# Patient Record
Sex: Female | Born: 1965 | Race: Black or African American | Hispanic: No | Smoking: Never smoker
Health system: Southern US, Community
[De-identification: ages and names within clinical notes are randomized; demographics above are authoritative.]

## PROBLEM LIST (undated history)

## (undated) DIAGNOSIS — M109 Gout, unspecified: Secondary | ICD-10-CM

## (undated) DIAGNOSIS — I10 Essential (primary) hypertension: Secondary | ICD-10-CM

## (undated) DIAGNOSIS — M1990 Unspecified osteoarthritis, unspecified site: Secondary | ICD-10-CM

## (undated) DIAGNOSIS — G629 Polyneuropathy, unspecified: Secondary | ICD-10-CM

## (undated) HISTORY — PX: TUBAL LIGATION: SHX77

## (undated) HISTORY — PX: ANKLE SURGERY: SHX546

## (undated) HISTORY — PX: TONSILLECTOMY AND ADENOIDECTOMY: SHX28

---

## 2013-07-12 ENCOUNTER — Ambulatory Visit: Payer: Self-pay | Admitting: Unknown Physician Specialty

## 2013-07-13 LAB — PATHOLOGY REPORT

## 2014-04-05 DIAGNOSIS — M1712 Unilateral primary osteoarthritis, left knee: Secondary | ICD-10-CM | POA: Insufficient documentation

## 2014-05-25 DIAGNOSIS — G8929 Other chronic pain: Secondary | ICD-10-CM | POA: Insufficient documentation

## 2014-05-25 DIAGNOSIS — Z6841 Body Mass Index (BMI) 40.0 and over, adult: Secondary | ICD-10-CM

## 2014-05-25 DIAGNOSIS — M25562 Pain in left knee: Secondary | ICD-10-CM | POA: Insufficient documentation

## 2014-05-25 DIAGNOSIS — Z79899 Other long term (current) drug therapy: Secondary | ICD-10-CM | POA: Insufficient documentation

## 2014-05-25 DIAGNOSIS — M25571 Pain in right ankle and joints of right foot: Secondary | ICD-10-CM

## 2014-12-29 NOTE — Op Note (Signed)
PATIENT NAME:  Gwendolyn Gutierrez, Gwendolyn Gutierrez MR#:  [id number] DATE OF BIRTH:  July 08, 1966  DATE OF PROCEDURE:  07/12/2013  PREOPERATIVE DIAGNOSIS: Chronic tonsillitis.   POSTOPERATIVE DIAGNOSIS: Chronic tonsillitis.   SURGEON: Beverly Gust, MD  OPERATION PERFORMED: Tonsillectomy.   OPERATIVE FINDINGS: Large cryptic tonsils.   DESCRIPTION OF THE PROCEDURE:  Kinlee Garrison was identified in the holding area and taken to the operating room and placed in the supine position.  After general endotracheal anesthesia, the table was turned 45 degrees and the patient was draped in the usual fashion for a tonsillectomy.  A mouth gag was inserted into the oral cavity.  There were large tonsils.  Beginning on the left-hand side a tenaculum was used to grasp the tonsil and the Bovie cautery was used to dissect it free from the fossa.  In a similar fashion, the right tonsil was removed.  Meticulous hemostasis was achieved using the Bovie cautery.  With both tonsils removed and no active bleeding, 0.5% plain Marcaine was used to inject the anterior and posterior tonsillar pillars bilaterally.  A total of 7 mL Marcaine was used.  The patient tolerated the procedure well and was awakened in the operating room and taken to the recovery room in stable condition.   CULTURES:  None.  SPECIMENS:  Tonsils.  ESTIMATED BLOOD LOSS:  Less than 10 ml.        ____________________________ Roena Malady, MD ctm:sg D: 07/12/2013 09:04:23 ET T: 07/12/2013 09:11:21 ET JOB#: [id number]  cc: Roena Malady, MD, <Dictator> Roena Malady MD ELECTRONICALLY SIGNED 07/25/2013 7:58

## 2015-06-04 ENCOUNTER — Other Ambulatory Visit (HOSPITAL_COMMUNITY): Payer: Self-pay | Admitting: Physician Assistant

## 2015-06-04 ENCOUNTER — Ambulatory Visit (HOSPITAL_COMMUNITY): Payer: Medicare Other | Attending: Family Medicine | Admitting: Physical Therapy

## 2015-06-04 DIAGNOSIS — R293 Abnormal posture: Secondary | ICD-10-CM | POA: Diagnosis present

## 2015-06-04 DIAGNOSIS — Z1231 Encounter for screening mammogram for malignant neoplasm of breast: Secondary | ICD-10-CM

## 2015-06-04 DIAGNOSIS — M25659 Stiffness of unspecified hip, not elsewhere classified: Secondary | ICD-10-CM | POA: Diagnosis present

## 2015-06-04 DIAGNOSIS — R262 Difficulty in walking, not elsewhere classified: Secondary | ICD-10-CM | POA: Insufficient documentation

## 2015-06-04 DIAGNOSIS — M6281 Muscle weakness (generalized): Secondary | ICD-10-CM | POA: Insufficient documentation

## 2015-06-04 DIAGNOSIS — M25562 Pain in left knee: Secondary | ICD-10-CM | POA: Diagnosis present

## 2015-06-04 DIAGNOSIS — M25673 Stiffness of unspecified ankle, not elsewhere classified: Secondary | ICD-10-CM

## 2015-06-04 NOTE — Patient Instructions (Signed)
   Heel Raises  Rise up onto toes, then return. Repeat 10 times, 2x/day.  Toe Raises  Raise up toes, and then return. Repeat 10 times, 2x/day.    Mini squats  Stand at countertop and bend knees for a mini squat. If you feel pain, do not go down as far.   Repeat 5-10 times, 2x/day.     LONG ARC QUAD - LAQ - HIGH SEAT  While seated with your knee in a bent position, slowly straighten your knee as you raise your foot upwards as shown. Hold for 2 seconds when it is straight, hold for 2 seconds when it is bent.   Repeat 10 times, 2x/day.   Functional Quadriceps: Sit to Stand   Sit on edge of chair, feet flat on floor. Stand upright, extending knees fully. Repeat _5___ times per set. Do _1___ sets per session. Do ___2_ sessions per day.  http://orth.exer.us/735   Copyright  VHI. All rights reserved.

## 2015-06-04 NOTE — Therapy (Signed)
[place], [place], [address] Phone: [phone]   Fax:  [phone]  Physical Therapy Evaluation  Patient Details  Name: Gwendolyn Gutierrez MRN: [id number] Date of Birth: 12-Sep-1965 Referring Provider:  Shade Flood, MD  Encounter Date: 06/04/2015      PT End of Session - 06/04/15 1717    Visit Number 1   Number of Visits 12   Date for PT Re-Evaluation 07/02/15   Authorization Type Medicare/Medicaid    Authorization Time Period 06/04/15 to 08/04/15   Authorization - Visit Number 1   Authorization - Number of Visits 10   PT Start Time 6629   PT Stop Time 1557   PT Time Calculation (min) 42 min   Activity Tolerance Patient tolerated treatment well;Patient limited by pain   Behavior During Therapy [facility] for tasks assessed/performed      No past medical history on file.  No past surgical history on file.  There were no vitals filed for this visit.  Visit Diagnosis:  Left knee pain - Plan: PT plan of care cert/re-cert  Muscle weakness - Plan: PT plan of care cert/re-cert  Poor posture - Plan: PT plan of care cert/re-cert  Difficulty walking - Plan: PT plan of care cert/re-cert  Ankle stiffness, unspecified laterality - Plan: PT plan of care cert/re-cert  Hip stiffness, unspecified laterality - Plan: PT plan of care cert/re-cert      Subjective Assessment - 06/04/15 1518    Subjective L knee is fairly painful especially with weight bearing; usually walks with  a walker. Does not take stairs, usually uses ramps. Has quite a bit of swelling. Tries not to do a lot because it hurts very badly.    Pertinent History L knee pain started about 2 years ago; reports that the arthitis has gotten worse recently, no injury or acute incident that made it start hurting worse. Has a history of R ankle arthoscopic surgery.   How long can you sit comfortably? no limits    How long can you stand comfortably? 3-5 minutes  and it gets uncomfortable    How long can you walk comfortably? 5-10 minutes with walker    Patient Stated Goals not need to use walker    Currently in Pain? No/denies  not in sitting, but if she stood up it would get to 8/10            [facility] PT Assessment - 06/04/15 0001    Assessment   Medical Diagnosis chronic L knee pain    Onset Date/Surgical Date --  about 2 years ago    Next MD Visit October with Dr. Terance Hart    Precautions   Precautions None   Restrictions   Weight Bearing Restrictions No   Balance Screen   Has the patient fallen in the past 6 months No   Has the patient had a decrease in activity level because of a fear of falling?  Yes   Is the patient reluctant to leave their home because of a fear of falling?  No   Prior Function   Level of Independence Needs assistance with homemaking;Independent with gait;Independent with transfers   Vocation On disability   Leisure church activities, spending time with grand-children    Observation/Other Assessments   Focus on Therapeutic Outcomes (FOTO)  72% limited    Posture/Postural Control   Posture Comments flexed at hips, reduced weight bearing L LE, reduced thoracic kyphosis and significant increase lumbar lordosis  AROM   Right Hip External Rotation  --  WFL    Right Hip Internal Rotation  20   Left Hip External Rotation  --  WFL    Left Hip Internal Rotation  20   Left Knee Extension 2   Left Knee Flexion 104   Right Ankle Dorsiflexion 5   Left Ankle Dorsiflexion 0   Strength   Right Hip Flexion 3-/5   Right Hip Extension 3/5   Right Hip ABduction 2/5   Left Hip Flexion 3-/5   Left Hip Extension 3/5   Left Hip ABduction 3-/5   Right Knee Flexion 4+/5   Right Knee Extension 5/5   Left Knee Flexion 4-/5   Left Knee Extension 4/5   Right Ankle Dorsiflexion 5/5   Left Ankle Dorsiflexion 3/5   Ambulation/Gait   Gait Comments reduced stance L/reduced step R, flexed at hips, proximal msucle weakness,  prnation B, reduced rotation of turnk and pelvis                            PT Education - 06/04/15 1717    Education provided Yes   Education Details prognosis, HEP, plan of care    Person(s) Educated Patient   Methods Explanation;Handout   Comprehension Verbalized understanding;Returned demonstration;Need further instruction          PT Short Term Goals - 06/04/15 1723    PT SHORT TERM GOAL #1   Title Patient will demonstrate bilateral ankle dorsiflexion range of at least 10 degrees to improve gait mechanics and improve functional loading    Time 3   Period Weeks   Status New   PT SHORT TERM GOAL #2   Title Patient will demonstrate bilateral hip IR range of at least 30 degrees to improve overall mechanics and reduce stress on knee    Time 3   Period Weeks   Status New   PT SHORT TERM GOAL #3   Title Patient will demonstrate equal weight bearing on each leg with even step lengths, improved gait speed, and pain no more than 5/10 with gait with walker    Time 3   Period Weeks   Status New   PT SHORT TERM GOAL #4   Title Patient to be independent in correctly and consistently performing appropriate HEP, to be updated PRN    Time 3   Period Weeks   Status New           PT Long Term Goals - 06/04/15 1725    PT LONG TERM GOAL #1   Title Patient to demonstrate bilateral ankle DF range of at least 15 degrees and bilateral hip IR range of at least 35 degrees    Time 6   Period Weeks   Status New   PT LONG TERM GOAL #2   Title Patient will demonstrate bilateral leg strength of 5/5 and proximal muscle strength of at least 4/5    Time 6   Period Weeks   Status New   PT LONG TERM GOAL #3   Title Patient to report that she has been participating in water exercise at the [facility] at least 3 times per week for at least 20 minutes in order to strengthen musculature around her knee and promote overall healtheir lifestyle    Time 6   Period Weeks   Status New    PT LONG TERM GOAL #4   Title Patient will demonstrate the ability  to perform static stance and ambulate with walker for at least 60 minutes with pain no more than 3/10   Time 6   Period Weeks   Status New               Plan - Jul 04, 2015 1718    Clinical Impression Statement Patient presents with long standing L knee pain, impairments in posture and gait mechanics, hip/ankle/knee stiffness, reduced functional activity tolerance, and reduced muscular strength. Patient reports that she has a history of surgeries on both of her ankles in the past, and is also considering bariatric surgery at some point. She was educated that [facility] does not offer aquatic PT but was given a form for the [employer] so that she can participate in aquatic exercise there. Patient will benefit from skilled PT services in order to address her functional deficits and assist her in reaching an optimal level of function.    Pt will benefit from skilled therapeutic intervention in order to improve on the following deficits Abnormal gait;Decreased endurance;Hypomobility;Obesity;Decreased activity tolerance;Decreased strength;Pain;Decreased mobility;Difficulty walking;Improper body mechanics;Decreased coordination;Impaired flexibility;Postural dysfunction   Rehab Potential Fair   Clinical Impairments Affecting Rehab Potential pre-disposed to joint pain due to weight    PT Frequency 2x / week   PT Duration 6 weeks   PT Treatment/Interventions ADLs/Self Care Home Management;Cryotherapy;Gait training;Stair training;Functional mobility training;Therapeutic activities;Therapeutic exercise;Balance training;Neuromuscular re-education;Patient/family education;Manual techniques   PT Next Visit Plan review HEP and goals; functional stretching and strengthening as tolerated, manual PRN    PT Home Exercise Plan given    Recommended Other Services aquatic exercise at [facility] and Agree with Plan of Care Patient          G-Codes  - 2015-07-04 1728    Functional Assessment Tool Used FOTO 72% limited    Functional Limitation Mobility: Walking and moving around   Mobility: Walking and Moving Around Current Status [phone]) At least 60 percent but less than 80 percent impaired, limited or restricted   Mobility: Walking and Moving Around Goal Status [phone]) At least 40 percent but less than 60 percent impaired, limited or restricted       Problem List There are no active problems to display for this patient.   Deniece Ree PT, DPT [phone]  [place] [address] [place], [place], [address] Phone: [phone]   Fax:  [phone]

## 2015-06-06 ENCOUNTER — Ambulatory Visit (HOSPITAL_COMMUNITY): Payer: Medicare Other | Admitting: Physical Therapy

## 2015-06-06 DIAGNOSIS — M25673 Stiffness of unspecified ankle, not elsewhere classified: Secondary | ICD-10-CM

## 2015-06-06 DIAGNOSIS — M25659 Stiffness of unspecified hip, not elsewhere classified: Secondary | ICD-10-CM

## 2015-06-06 DIAGNOSIS — R262 Difficulty in walking, not elsewhere classified: Secondary | ICD-10-CM

## 2015-06-06 DIAGNOSIS — R293 Abnormal posture: Secondary | ICD-10-CM

## 2015-06-06 DIAGNOSIS — M25562 Pain in left knee: Secondary | ICD-10-CM | POA: Diagnosis not present

## 2015-06-06 DIAGNOSIS — M6281 Muscle weakness (generalized): Secondary | ICD-10-CM

## 2015-06-06 NOTE — Therapy (Signed)
[place], [place], [address] Phone: [phone]   Fax:  [phone]  Physical Therapy Treatment  Patient Details  Name: Gwendolyn Gutierrez MRN: [id number] Date of Birth: 1966/03/28 Referring Provider:  Shade Flood, MD  Encounter Date: 06/06/2015      PT End of Session - 06/06/15 1304    Visit Number 2   Number of Visits 12   Date for PT Re-Evaluation 07/02/15   Authorization Type Medicare/Medicaid    Authorization Time Period 06/04/15 to 08/04/15   Authorization - Visit Number 2   Authorization - Number of Visits 10   PT Start Time 1015   PT Stop Time 1056   PT Time Calculation (min) 41 min   Activity Tolerance Patient tolerated treatment well;Patient limited by pain   Behavior During Therapy [facility] for tasks assessed/performed      No past medical history on file.  No past surgical history on file.  There were no vitals filed for this visit.  Visit Diagnosis:  Left knee pain  Muscle weakness  Poor posture  Difficulty walking  Ankle stiffness, unspecified laterality  Hip stiffness, unspecified laterality      Subjective Assessment - 06/06/15 1019    Subjective Patient reports she is doing well today, reports she has been doing HEP and it has been helping already    Pertinent History L knee pain started about 2 years ago; reports that the arthitis has gotten worse recently, no injury or acute incident that made it start hurting worse. Has a history of R ankle arthoscopic surgery.   Currently in Pain? Yes   Pain Score 8    Pain Location Knee  and back    Pain Orientation Left                         [facility] Adult PT Treatment/Exercise - 06/06/15 0001    Knee/Hip Exercises: Stretches   Active Hamstring Stretch Both;30 seconds;2 reps   Active Hamstring Stretch Limitations supine with rope    Quad Stretch Both;2 reps;30 seconds   Quad Stretch Limitations prone with rope    Piriformis Stretch --   Piriformis Stretch Limitations --   Press photographer Both;3 reps;30 seconds   Gastroc Stretch Limitations slantboard    Knee/Hip Exercises: Standing   Heel Raises Both;1 set   Heel Raises Limitations toe and heel raises    Knee Flexion Both;1 set;15 reps   Knee Flexion Limitations standing    Forward Lunges Both;1 set;10 reps   Forward Lunges Limitations  inch box    Rocker Board Limitations x20AP, x20 lateral B HHA    Other Standing Knee Exercises 3D hip excursions 1x10   Knee/Hip Exercises: Seated   Long Arc Quad Both;1 set;15 reps   Ball Squeeze 1x10   Abduction/Adduction  Both;1 set;10 reps   Abd/Adduction Limitations manual resistance    Knee/Hip Exercises: Supine   Bridges Both;1 set;10 reps   Straight Leg Raises Both;1 set;10 reps   Straight Leg Raise with External Rotation Both;1 set;10 reps                PT Education - 06/06/15 1304    Education provided Yes   Education Details advised to not push through sharp pain during exercise, reviewed initial eval; patient reports that she signed up for [employer] today    Person(s) Educated Patient   Methods Explanation   Comprehension Verbalized understanding  PT Short Term Goals - 06/04/15 1723    PT SHORT TERM GOAL #1   Title Patient will demonstrate bilateral ankle dorsiflexion range of at least 10 degrees to improve gait mechanics and improve functional loading    Time 3   Period Weeks   Status New   PT SHORT TERM GOAL #2   Title Patient will demonstrate bilateral hip IR range of at least 30 degrees to improve overall mechanics and reduce stress on knee    Time 3   Period Weeks   Status New   PT SHORT TERM GOAL #3   Title Patient will demonstrate equal weight bearing on each leg with even step lengths, improved gait speed, and pain no more than 5/10 with gait with walker    Time 3   Period Weeks   Status New   PT SHORT TERM GOAL #4   Title Patient to be independent in  correctly and consistently performing appropriate HEP, to be updated PRN    Time 3   Period Weeks   Status New           PT Long Term Goals - 06/04/15 1725    PT LONG TERM GOAL #1   Title Patient to demonstrate bilateral ankle DF range of at least 15 degrees and bilateral hip IR range of at least 35 degrees    Time 6   Period Weeks   Status New   PT LONG TERM GOAL #2   Title Patient will demonstrate bilateral leg strength of 5/5 and proximal muscle strength of at least 4/5    Time 6   Period Weeks   Status New   PT LONG TERM GOAL #3   Title Patient to report that she has been participating in water exercise at the [facility] at least 3 times per week for at least 20 minutes in order to strengthen musculature around her knee and promote overall healtheir lifestyle    Time 6   Period Weeks   Status New   PT LONG TERM GOAL #4   Title Patient will demonstrate the ability to perform static stance and ambulate with walker for at least 60 minutes with pain no more than 3/10   Time 6   Period Weeks   Status New               Plan - 06/06/15 1307    Clinical Impression Statement Introduced functional stretching and standing activities this morning with fair tolerance by pateint; she is able to perform supine, seated, and standing activities with both LEs on floor well but is limited by pain in tasks that involve even short period of SLS such as standing knee flexion. patient reports that HEP is going well and that she is palnning on starting water exercise at [facility] soon.    Pt will benefit from skilled therapeutic intervention in order to improve on the following deficits Abnormal gait;Decreased endurance;Hypomobility;Obesity;Decreased activity tolerance;Decreased strength;Pain;Decreased mobility;Difficulty walking;Improper body mechanics;Decreased coordination;Impaired flexibility;Postural dysfunction   Rehab Potential Fair   Clinical Impairments Affecting Rehab Potential pre-disposed  to joint pain due to weight    PT Frequency 2x / week   PT Duration 6 weeks   PT Treatment/Interventions ADLs/Self Care Home Management;Cryotherapy;Gait training;Stair training;Functional mobility training;Therapeutic activities;Therapeutic exercise;Balance training;Neuromuscular re-education;Patient/family education;Manual techniques   PT Next Visit Plan continue functional stretching and strength as tolerated, manual PRN    PT Home Exercise Plan given    Consulted and Agree with Plan of Care Patient  Problem List There are no active problems to display for this patient.   Deniece Ree PT, DPT [phone]  [facility] [address] [place], [place], [address] Phone: [phone]   Fax:  [phone]

## 2015-06-11 ENCOUNTER — Ambulatory Visit (HOSPITAL_COMMUNITY): Payer: Self-pay

## 2015-06-11 ENCOUNTER — Ambulatory Visit (HOSPITAL_COMMUNITY): Payer: Medicare Other | Attending: Family Medicine | Admitting: Physical Therapy

## 2015-06-11 DIAGNOSIS — R293 Abnormal posture: Secondary | ICD-10-CM | POA: Insufficient documentation

## 2015-06-11 DIAGNOSIS — M25659 Stiffness of unspecified hip, not elsewhere classified: Secondary | ICD-10-CM | POA: Insufficient documentation

## 2015-06-11 DIAGNOSIS — M25673 Stiffness of unspecified ankle, not elsewhere classified: Secondary | ICD-10-CM | POA: Insufficient documentation

## 2015-06-11 DIAGNOSIS — M25562 Pain in left knee: Secondary | ICD-10-CM | POA: Insufficient documentation

## 2015-06-11 DIAGNOSIS — M6281 Muscle weakness (generalized): Secondary | ICD-10-CM | POA: Insufficient documentation

## 2015-06-11 DIAGNOSIS — R262 Difficulty in walking, not elsewhere classified: Secondary | ICD-10-CM | POA: Insufficient documentation

## 2015-06-13 ENCOUNTER — Ambulatory Visit (HOSPITAL_COMMUNITY): Payer: Medicare Other | Admitting: Physical Therapy

## 2015-06-13 DIAGNOSIS — M6281 Muscle weakness (generalized): Secondary | ICD-10-CM | POA: Diagnosis present

## 2015-06-13 DIAGNOSIS — R293 Abnormal posture: Secondary | ICD-10-CM

## 2015-06-13 DIAGNOSIS — M25673 Stiffness of unspecified ankle, not elsewhere classified: Secondary | ICD-10-CM | POA: Diagnosis present

## 2015-06-13 DIAGNOSIS — R262 Difficulty in walking, not elsewhere classified: Secondary | ICD-10-CM | POA: Diagnosis present

## 2015-06-13 DIAGNOSIS — M25562 Pain in left knee: Secondary | ICD-10-CM | POA: Diagnosis present

## 2015-06-13 DIAGNOSIS — M25659 Stiffness of unspecified hip, not elsewhere classified: Secondary | ICD-10-CM | POA: Diagnosis present

## 2015-06-13 NOTE — Therapy (Signed)
[place], [place], [address] Phone: [phone]   Fax:  [phone]  Physical Therapy Treatment  Patient Details  Name: Cristianna Cyr MRN: [id number] Date of Birth: 1966-01-14 Referring Provider:  Shade Flood, MD  Encounter Date: 06/13/2015      PT End of Session - 06/13/15 1019    Visit Number 3   Number of Visits 12   Date for PT Re-Evaluation 07/02/15   Authorization Type Medicare/Medicaid    Authorization Time Period 06/04/15 to 08/04/15   Authorization - Visit Number 3   Authorization - Number of Visits 10   PT Start Time 0930   PT Stop Time 1010   PT Time Calculation (min) 40 min   Activity Tolerance Patient tolerated treatment well   Behavior During Therapy [facility] for tasks assessed/performed      No past medical history on file.  No past surgical history on file.  There were no vitals filed for this visit.  Visit Diagnosis:  Left knee pain  Muscle weakness  Poor posture  Difficulty walking      Subjective Assessment - 06/13/15 0933    Subjective Pt reports that she feels like PT is really helping, she is starting to feel better.    Currently in Pain? Yes   Pain Score 7    Pain Location Knee   Pain Orientation Left                         [facility] Adult PT Treatment/Exercise - 06/13/15 0001    Knee/Hip Exercises: Stretches   Active Hamstring Stretch Both;30 seconds;2 reps   Active Hamstring Stretch Limitations 12" step   Gastroc Stretch Both;3 reps;30 seconds   Gastroc Stretch Limitations slantboard    Knee/Hip Exercises: Standing   Heel Raises 15 reps   Knee Flexion Both;1 set;15 reps   Knee Flexion Limitations standing    Forward Lunges Both;1 set;10 reps   Forward Lunges Limitations 6in box   Hip Abduction Both;1 set;10 reps   Hip Extension Both;1 set;10 reps   Lateral Step Up 5 reps;Step Height: 4"   Forward Step Up 10 reps;Step Height: 4"   Rocker  Board 2 minutes   Rocker Board Limitations R/L and A/P   Knee/Hip Exercises: Seated   Long Arc Quad Both;1 set;15 reps   Long Arc Quad Weight 2 lbs.   Knee/Hip Exercises: Supine   Bridges 15 reps;1 set   Straight Leg Raises Both;1 set;10 reps   Straight Leg Raise with External Rotation Both;1 set;10 reps                  PT Short Term Goals - 06/04/15 1723    PT SHORT TERM GOAL #1   Title Patient will demonstrate bilateral ankle dorsiflexion range of at least 10 degrees to improve gait mechanics and improve functional loading    Time 3   Period Weeks   Status New   PT SHORT TERM GOAL #2   Title Patient will demonstrate bilateral hip IR range of at least 30 degrees to improve overall mechanics and reduce stress on knee    Time 3   Period Weeks   Status New   PT SHORT TERM GOAL #3   Title Patient will demonstrate equal weight bearing on each leg with even step lengths, improved gait speed, and pain no more than 5/10 with gait with walker    Time 3  Period Weeks   Status New   PT SHORT TERM GOAL #4   Title Patient to be independent in correctly and consistently performing appropriate HEP, to be updated PRN    Time 3   Period Weeks   Status New           PT Long Term Goals - 06/04/15 1725    PT LONG TERM GOAL #1   Title Patient to demonstrate bilateral ankle DF range of at least 15 degrees and bilateral hip IR range of at least 35 degrees    Time 6   Period Weeks   Status New   PT LONG TERM GOAL #2   Title Patient will demonstrate bilateral leg strength of 5/5 and proximal muscle strength of at least 4/5    Time 6   Period Weeks   Status New   PT LONG TERM GOAL #3   Title Patient to report that she has been participating in water exercise at the [facility] at least 3 times per week for at least 20 minutes in order to strengthen musculature around her knee and promote overall healtheir lifestyle    Time 6   Period Weeks   Status New   PT LONG TERM GOAL #4    Title Patient will demonstrate the ability to perform static stance and ambulate with walker for at least 60 minutes with pain no more than 3/10   Time 6   Period Weeks   Status New               Plan - 06/13/15 1020    Clinical Impression Statement Continued with functional strengthening and stretching today. Pt required frequent rest breaks due to fatigue. Pt reported increased knee pain on L side with lateral step ups, pt instructed to stop after 5 repetitions due to pain.    PT Next Visit Plan Continue functional strengthening, manual PRN        Problem List There are no active problems to display for this patient.   Hilma Favors, PT, DPT [phone] 06/13/2015, 10:33 AM  [place], [place], [address] Phone: [phone]   Fax:  [phone]

## 2015-06-18 ENCOUNTER — Ambulatory Visit (HOSPITAL_COMMUNITY): Payer: Medicare Other | Admitting: Physical Therapy

## 2015-06-18 DIAGNOSIS — M6281 Muscle weakness (generalized): Secondary | ICD-10-CM

## 2015-06-18 DIAGNOSIS — R293 Abnormal posture: Secondary | ICD-10-CM

## 2015-06-18 DIAGNOSIS — R262 Difficulty in walking, not elsewhere classified: Secondary | ICD-10-CM

## 2015-06-18 DIAGNOSIS — M25659 Stiffness of unspecified hip, not elsewhere classified: Secondary | ICD-10-CM

## 2015-06-18 DIAGNOSIS — M25562 Pain in left knee: Secondary | ICD-10-CM

## 2015-06-18 DIAGNOSIS — M25673 Stiffness of unspecified ankle, not elsewhere classified: Secondary | ICD-10-CM

## 2015-06-18 NOTE — Therapy (Addendum)
[place], [place], [address] Phone: [phone]   Fax:  [phone]  Physical Therapy Treatment  Patient Details  Name: Gwendolyn Gutierrez MRN: [id number] Date of Birth: 01-20-1966 Referring Provider:  Shade Flood, MD  Encounter Date: 06/18/2015      PT End of Session - 06/18/15 1039    Visit Number 4   Number of Visits 12   Authorization Type Medicare/Medicaid    Authorization Time Period 06/04/15 to 08/04/15   Authorization - Visit Number 4   Authorization - Number of Visits 10   PT Start Time 1443   PT Stop Time 1100   PT Time Calculation (min) 42 min      No past medical history on file.  No past surgical history on file.  There were no vitals filed for this visit.  Visit Diagnosis:  Left knee pain  Muscle weakness  Poor posture  Difficulty walking  Ankle stiffness, unspecified laterality  Hip stiffness, unspecified laterality      Subjective Assessment - 06/18/15 1019    Subjective Pt states that she is feeling alot better.  Able to walk easier    Currently in Pain? Yes   Pain Score 3                 [facility] Adult PT Treatment/Exercise - 06/18/15 0001    Knee/Hip Exercises: Stretches   Active Hamstring Stretch --   Active Hamstring Stretch Limitations --   Gastroc Stretch Both;3 reps;30 seconds   Gastroc Stretch Limitations slantboard    Knee/Hip Exercises: Standing   Heel Raises 15 reps   Heel Raises Limitations combined with squats    Knee Flexion Both;1 set;15 reps   Knee Flexion Limitations 4#   Forward Lunges Both;1 set;10 reps   Forward Lunges Limitations 6in box   Hip Abduction --   Hip Extension --   Lateral Step Up 10 reps;Step Height: 4"   Forward Step Up 10 reps;Step Height: 2"   Functional Squat 10 reps   Rocker Board 2 minutes   Rocker Board Limitations R/L and A/P   SLS x 5 B    Gait Training 225  working in walking upright not with forward bent    Other Standing Knee Exercises hip extension x10    Other Standing Knee Exercises side step; retro gt x 2 RT    Knee/Hip Exercises: Seated   Long Arc Quad --   Long Arc [employer] --   Knee/Hip Exercises: Supine   Bridges --   Straight Leg Raises --   Straight Leg Raise with External Rotation --                  PT Short Term Goals - 06/04/15 1723    PT SHORT TERM GOAL #1   Title Patient will demonstrate bilateral ankle dorsiflexion range of at least 10 degrees to improve gait mechanics and improve functional loading    Time 3   Period Weeks   Status New   PT SHORT TERM GOAL #2   Title Patient will demonstrate bilateral hip IR range of at least 30 degrees to improve overall mechanics and reduce stress on knee    Time 3   Period Weeks   Status New   PT SHORT TERM GOAL #3   Title Patient will demonstrate equal weight bearing on each leg with even step lengths, improved gait speed, and pain no more than 5/10 with gait with  walker    Time 3   Period Weeks   Status New   PT SHORT TERM GOAL #4   Title Patient to be independent in correctly and consistently performing appropriate HEP, to be updated PRN    Time 3   Period Weeks   Status New           PT Long Term Goals - 06/04/15 1725    PT LONG TERM GOAL #1   Title Patient to demonstrate bilateral ankle DF range of at least 15 degrees and bilateral hip IR range of at least 35 degrees    Time 6   Period Weeks   Status New   PT LONG TERM GOAL #2   Title Patient will demonstrate bilateral leg strength of 5/5 and proximal muscle strength of at least 4/5    Time 6   Period Weeks   Status New   PT LONG TERM GOAL #3   Title Patient to report that she has been participating in water exercise at the [facility] at least 3 times per week for at least 20 minutes in order to strengthen musculature around her knee and promote overall healtheir lifestyle    Time 6   Period Weeks   Status New   PT LONG TERM GOAL #4   Title  Patient will demonstrate the ability to perform static stance and ambulate with walker for at least 60 minutes with pain no more than 3/10   Time 6   Period Weeks   Status New               Plan - 06/18/15 1044    Clinical Impression Statement Pt needed multiple rest breaks,(between each exercise) as wel as verbal cuing to maintain good form with exercises.  Worked on pt keeping correct posture while walking.  Added SLS for balance.     Pt will benefit from skilled therapeutic intervention in order to improve on the following deficits Abnormal gait;Decreased endurance;Hypomobility;Obesity;Decreased activity tolerance;Decreased strength;Pain;Decreased mobility;Difficulty walking;Improper body mechanics;Decreased coordination;Impaired flexibility;Postural dysfunction   PT Treatment/Interventions ADLs/Self Care Home Management;Cryotherapy;Gait training;Stair training;Functional mobility training;Therapeutic activities;Therapeutic exercise;Balance training;Neuromuscular re-education;Patient/family education;Manual techniques   PT Next Visit Plan Continue functional strengthening maintaining good technique and balance.      [id number] CK G8980 CK   Problem List There are no active problems to display for this patient.  Rayetta Humphrey, PT CLT [phone] 06/18/2015, 10:58 AM  [place], [place], [address] Phone: [phone]   Fax:  [phone]   PHYSICAL THERAPY DISCHARGE SUMMARY  Visits from Start of Care: 4  Current functional level related to goals / functional outcomes: Partially met   Remaining deficits: See above   Education / Equipment: HEP  Plan: Patient agrees to discharge.  Patient goals were partially met. Patient is being discharged due to not returning since the last visit.  ?????      \  Rayetta Humphrey, PT CLT [phone]

## 2015-06-20 ENCOUNTER — Ambulatory Visit (HOSPITAL_COMMUNITY): Payer: Medicare Other

## 2015-06-20 ENCOUNTER — Telehealth (HOSPITAL_COMMUNITY): Payer: Self-pay

## 2015-06-20 NOTE — Telephone Encounter (Signed)
No show, called home, pt. stated she received letter from [facility] stateing she was declined for coverage and does not have the finances to afford cost of therapy right now.  Pt stated knee feels a lot better and has been completing HEP daily.  Is going to call Medicare and see what they will cover.  Going to cancel further apts and pt to call for additional appointments if Medicare will cover.  [address] [place]; [facility] [phone]

## 2015-06-25 ENCOUNTER — Encounter (HOSPITAL_COMMUNITY): Payer: Medicare Other | Admitting: Physical Therapy

## 2015-06-27 ENCOUNTER — Encounter (HOSPITAL_COMMUNITY): Payer: Medicare Other | Admitting: Physical Therapy

## 2015-07-02 ENCOUNTER — Encounter (HOSPITAL_COMMUNITY): Payer: Medicare Other | Admitting: Physical Therapy

## 2015-07-04 ENCOUNTER — Encounter (HOSPITAL_COMMUNITY): Payer: Medicare Other | Admitting: Physical Therapy

## 2015-07-09 ENCOUNTER — Encounter (HOSPITAL_COMMUNITY): Payer: Medicare Other | Admitting: Physical Therapy

## 2015-07-11 ENCOUNTER — Encounter (HOSPITAL_COMMUNITY): Payer: Medicare Other | Admitting: Physical Therapy

## 2015-10-02 ENCOUNTER — Telehealth (HOSPITAL_COMMUNITY): Payer: Self-pay | Admitting: Physical Therapy

## 2015-10-02 NOTE — Telephone Encounter (Signed)
MD requested verification of visits, Eval Date 06-03-09-06-2015 cx all other apptments due to [place] declined coverage per epic note 06/20/15. NF 10/02/15

## 2015-11-28 ENCOUNTER — Other Ambulatory Visit (HOSPITAL_COMMUNITY): Payer: Self-pay | Admitting: Family Medicine

## 2015-11-28 DIAGNOSIS — M79662 Pain in left lower leg: Principal | ICD-10-CM

## 2015-11-28 DIAGNOSIS — M79661 Pain in right lower leg: Secondary | ICD-10-CM

## 2015-11-29 ENCOUNTER — Ambulatory Visit (HOSPITAL_COMMUNITY): Payer: Medicare Other

## 2015-12-17 DIAGNOSIS — M25572 Pain in left ankle and joints of left foot: Secondary | ICD-10-CM

## 2015-12-17 DIAGNOSIS — R200 Anesthesia of skin: Secondary | ICD-10-CM | POA: Insufficient documentation

## 2015-12-17 DIAGNOSIS — M25571 Pain in right ankle and joints of right foot: Secondary | ICD-10-CM

## 2015-12-17 DIAGNOSIS — G8929 Other chronic pain: Secondary | ICD-10-CM | POA: Insufficient documentation

## 2016-01-07 DIAGNOSIS — M19072 Primary osteoarthritis, left ankle and foot: Secondary | ICD-10-CM | POA: Insufficient documentation

## 2016-01-07 DIAGNOSIS — M19071 Primary osteoarthritis, right ankle and foot: Secondary | ICD-10-CM | POA: Insufficient documentation

## 2016-01-07 DIAGNOSIS — E559 Vitamin D deficiency, unspecified: Secondary | ICD-10-CM | POA: Insufficient documentation

## 2016-01-07 DIAGNOSIS — R7982 Elevated C-reactive protein (CRP): Secondary | ICD-10-CM | POA: Insufficient documentation

## 2016-06-19 DIAGNOSIS — M170 Bilateral primary osteoarthritis of knee: Secondary | ICD-10-CM | POA: Insufficient documentation

## 2016-12-02 ENCOUNTER — Other Ambulatory Visit (HOSPITAL_BASED_OUTPATIENT_CLINIC_OR_DEPARTMENT_OTHER): Payer: Self-pay

## 2016-12-02 DIAGNOSIS — G4733 Obstructive sleep apnea (adult) (pediatric): Secondary | ICD-10-CM

## 2016-12-12 ENCOUNTER — Ambulatory Visit: Payer: Medicare Other | Attending: Neurology | Admitting: Neurology

## 2016-12-12 DIAGNOSIS — G4733 Obstructive sleep apnea (adult) (pediatric): Secondary | ICD-10-CM | POA: Diagnosis not present

## 2016-12-20 DIAGNOSIS — M109 Gout, unspecified: Secondary | ICD-10-CM | POA: Insufficient documentation

## 2016-12-20 NOTE — Procedures (Signed)
  [place] A. Merlene Laughter, MD     [email or web address]             NOCTURNAL POLYSOMNOGRAPHY   LOCATION: [facility]   atient Name: Gwendolyn Gutierrez, [place] Date: 12/12/2016 Gender: Female D.O.B: August 12, 1966 Age (years): 43 Referring Provider: Barton Fanny NP Height (inches): 61 Interpreting Physician: Phillips Odor MD, ABSM Weight (lbs): 369 RPSGT: Peak, Robert BMI: 70 MRN: [id number] Neck Size: 15.00 CLINICAL INFORMATION Sleep Study Type: NPSG  Indication for sleep study: N/A  Epworth Sleepiness Score: 11  SLEEP STUDY TECHNIQUE As per the AASM Manual for the Scoring of Sleep and Associated Events v2.3 (April 2016) with a hypopnea requiring 4% desaturations.  The channels recorded and monitored were frontal, central and occipital EEG, electrooculogram (EOG), submentalis EMG (chin), nasal and oral airflow, thoracic and abdominal wall motion, anterior tibialis EMG, snore microphone, electrocardiogram, and pulse oximetry.  MEDICATIONS Medications self-administered by patient taken the night of the study : N/A  Current Outpatient Prescriptions:  .  gabapentin (NEURONTIN) 100 MG capsule, Take 100 mg by mouth 3 (three) times daily., Disp: , Rfl:  .  traMADol (ULTRAM) 50 MG tablet, Take by mouth every 6 (six) hours as needed., Disp: , Rfl:    SLEEP ARCHITECTURE The study was initiated at 9:41:30 PM and ended at 5:13:58 AM.  Sleep onset time was 25.5 minutes and the sleep efficiency was 86.5%. The total sleep time was 391.5 minutes.  Stage REM latency was 238.0 minutes.  The patient spent 14.81% of the night in stage N1 sleep, 77.39% in stage N2 sleep, 0.00% in stage N3 and 7.79% in REM.  Alpha intrusion was absent.  Supine sleep was 27.09%.  RESPIRATORY PARAMETERS The overall apnea/hypopnea index (AHI) was 4.1 per hour. There were 0 total apneas, including 0 obstructive, 0 central and 0 mixed apneas. There were 27 hypopneas and 49 RERAs.  The AHI  during Stage REM sleep was 15.7 per hour.  AHI while supine was 6.8 per hour.  The mean oxygen saturation was 95.91%. The minimum SpO2 during sleep was 83.00%.  Moderate snoring was noted during this study.  CARDIAC DATA The 2 lead EKG demonstrated sinus rhythm. The mean heart rate was 86.14 beats per minute. Other EKG findings include: None. LEG MOVEMENT DATA The total PLMS were 0 with a resulting PLMS index of 0.00. Associated arousal with leg movement index was 0.0.  IMPRESSIONS - No significant obstructive sleep apnea occurred during this study. - Absent slow wave sleep is noted.    Delano Metz, MD Diplomate, American Board of Sleep Medicine.   ELECTRONICALLY SIGNED ON:  12/20/2016, 9:01 AM  [facility] PH: [phone]   FX: [phone] [place]

## 2018-05-27 DIAGNOSIS — D126 Benign neoplasm of colon, unspecified: Secondary | ICD-10-CM | POA: Insufficient documentation

## 2019-01-29 ENCOUNTER — Other Ambulatory Visit: Payer: Self-pay

## 2019-01-29 ENCOUNTER — Encounter (HOSPITAL_COMMUNITY): Payer: Self-pay | Admitting: *Deleted

## 2019-01-29 ENCOUNTER — Emergency Department (HOSPITAL_COMMUNITY)
Admission: EM | Admit: 2019-01-29 | Discharge: 2019-01-30 | Disposition: A | Discharge status: Home / Self Care | Payer: 59 | Attending: Emergency Medicine | Admitting: Emergency Medicine

## 2019-01-29 ENCOUNTER — Emergency Department (HOSPITAL_COMMUNITY): Payer: 59

## 2019-01-29 DIAGNOSIS — Y9389 Activity, other specified: Secondary | ICD-10-CM | POA: Insufficient documentation

## 2019-01-29 DIAGNOSIS — S82135A Nondisplaced fracture of medial condyle of left tibia, initial encounter for closed fracture: Secondary | ICD-10-CM | POA: Diagnosis not present

## 2019-01-29 DIAGNOSIS — X501XXA Overexertion from prolonged static or awkward postures, initial encounter: Secondary | ICD-10-CM | POA: Diagnosis not present

## 2019-01-29 DIAGNOSIS — Z79899 Other long term (current) drug therapy: Secondary | ICD-10-CM | POA: Diagnosis not present

## 2019-01-29 DIAGNOSIS — Y92810 Car as the place of occurrence of the external cause: Secondary | ICD-10-CM | POA: Insufficient documentation

## 2019-01-29 DIAGNOSIS — Y999 Unspecified external cause status: Secondary | ICD-10-CM | POA: Diagnosis not present

## 2019-01-29 DIAGNOSIS — I10 Essential (primary) hypertension: Secondary | ICD-10-CM | POA: Insufficient documentation

## 2019-01-29 DIAGNOSIS — S82132A Displaced fracture of medial condyle of left tibia, initial encounter for closed fracture: Secondary | ICD-10-CM

## 2019-01-29 DIAGNOSIS — S8992XA Unspecified injury of left lower leg, initial encounter: Secondary | ICD-10-CM | POA: Diagnosis present

## 2019-01-29 HISTORY — DX: Essential (primary) hypertension: I10

## 2019-01-29 HISTORY — DX: Gout, unspecified: M10.9

## 2019-01-29 HISTORY — DX: Unspecified osteoarthritis, unspecified site: M19.90

## 2019-01-29 HISTORY — DX: Polyneuropathy, unspecified: G62.9

## 2019-01-29 NOTE — ED Triage Notes (Signed)
Pt states that she was getting into her car when she felt  Her left knee "give out and pop" co pain to left knee area,

## 2019-01-30 ENCOUNTER — Emergency Department (HOSPITAL_COMMUNITY): Payer: 59

## 2019-01-30 DIAGNOSIS — S82135A Nondisplaced fracture of medial condyle of left tibia, initial encounter for closed fracture: Secondary | ICD-10-CM | POA: Diagnosis not present

## 2019-01-30 MED ORDER — OXYCODONE-ACETAMINOPHEN 5-325 MG PO TABS: 1.0000 | ORAL_TABLET | ORAL | 0 refills | Status: DC | PRN

## 2019-01-30 MED ORDER — OXYCODONE-ACETAMINOPHEN 5-325 MG PO TABS
1.0000 | ORAL_TABLET | Freq: Once | ORAL | Status: AC
  Administered 2019-01-30: 1 via ORAL
  Filled 2019-01-30: qty 1

## 2019-01-30 NOTE — ED Provider Notes (Signed)
[facility] EMERGENCY DEPARTMENT Provider Note   CSN: [id number] Arrival date & time: 01/29/19  2258    History   Chief Complaint Chief Complaint  Patient presents with  . Fall    HPI Gwendolyn Gutierrez is a 53 y.o. female.   The history is provided by the patient.  Fall   She has history of osteoarthritis, chronic pain and comes in having injured her left knee.  She was getting into a car when her left knee hyperextended and she heard something pop in her knee.  She is complaining of pain at 8/10.  Is worse with movement and attempting to bear weight.  She denies other injury.  She did not actually fall.  Past Medical History:  Diagnosis Date  . Arthritis   . Gout   . Hypertension   . Neuropathy     Patient Active Problem List   Diagnosis Date Noted  . Gout 12/20/2016  . Osteoarthritis of both knees 06/19/2016  . Elevated C-reactive protein 01/07/2016  . Chronic pain of both ankles 12/17/2015  . Chronic pain of right ankle 05/25/2014  . High risk medication use 05/25/2014  . Knee pain, left anterior 05/25/2014  . Morbid obesity with BMI of 60.0-69.9, adult ([place]) 05/25/2014  . Osteoarthritis of left knee 04/05/2014    Past Surgical History:  Procedure Laterality Date  . ANKLE SURGERY    . TUBAL LIGATION       OB History   No obstetric history on file.      Home Medications    Prior to Admission medications   Medication Sig Start Date End Date Taking? Authorizing Provider  gabapentin (NEURONTIN) 100 MG capsule Take 100 mg by mouth 3 (three) times daily.    [provider]  traMADol (ULTRAM) 50 MG tablet Take by mouth every 6 (six) hours as needed.    [provider]    Family History No family history on file.  Social History Social History   Tobacco Use  . Smoking status: Never Smoker  . Smokeless tobacco: Never Used  Substance Use Topics  . Alcohol use: Never    Frequency: Never  . Drug use: Never     Allergies    Patient has no known allergies.   Review of Systems Review of Systems  All other systems reviewed and are negative.    Physical Exam Updated Vital Signs BP (!) 134/97 (BP Location: Right Arm)   Pulse 92   Temp 98.4 F (36.9 C) (Oral)   Resp 19   Ht 4\' 11"  (1.499 m)   Wt (!) 166 kg   SpO2 100%   BMI 73.92 kg/m   Physical Exam Vitals signs and nursing note reviewed.    Morbidly obese 53 year old female, resting comfortably and in no acute distress. Vital signs are significant for elevated blood pressure. Oxygen saturation is 100%, which is normal. Head is normocephalic and atraumatic. PERRLA, EOMI. Oropharynx is clear. Neck is nontender and supple without adenopathy or JVD. Back is nontender and there is no CVA tenderness. Lungs are clear without rales, wheezes, or rhonchi. Chest is nontender. Heart has regular rate and rhythm without murmur. Abdomen is soft, flat, nontender without masses or hepatosplenomegaly and peristalsis is normoactive. Extremities: Small effusion present left knee.  No instability on valgus or varus stress.  Lachman test is negative.  Pain with passive range of motion left knee, unable to perform McMurray's test because of pain.  Distal neurovascular exam is intact. Skin  is warm and dry without rash. Neurologic: Mental status is normal, cranial nerves are intact, there are no motor or sensory deficits.  ED Treatments / Results    Radiology Ct Knee Left Wo Contrast  Result Date: 01/30/2019 CLINICAL DATA:  Knee pain.  Fracture suspected. EXAM: CT OF THE left KNEE WITHOUT CONTRAST TECHNIQUE: Multidetector CT imaging of the left knee was performed according to the standard protocol. Multiplanar CT image reconstructions were also generated. COMPARISON:  X-ray from same day FINDINGS: Bones/Joint/Cartilage There is a nondisplaced fracture extending through the posterior aspect of the medial tibial plateau. There are end-stage degenerative changes involving  all 3 compartments. Ligaments Suboptimally assessed by CT. Muscles and Tendons Unremarkable. Soft tissues A small hyperdense joint effusion is noted, likely representing hemarthrosis. IMPRESSION: 1. Examination limited by patient body habitus. 2. Nondisplaced fracture involving the posteromedial tibial plateau/condyle. 3. Small hyperdense joint effusion consistent with hemarthrosis. 4. Tricompartmental end-stage degenerative changes. Electronically Signed   By: Constance Holster M.D.   On: 01/30/2019 01:22   Dg Knee Complete 4 Views Left  Result Date: 01/30/2019 CLINICAL DATA:  Pain status post fall EXAM: LEFT KNEE - COMPLETE 4+ VIEW COMPARISON:  None. FINDINGS: Examination is limited by patient body habitus. There are end-stage degenerative changes of the left knee. There is no displaced fracture. There is some subchondral lucency involving the lateral tibial plateau. No dislocation. There is a small suprapatellar joint effusion. IMPRESSION: 1. Examination limited by patient body habitus. 2. There is a subtle lucency through the lateral tibial plateau that is favored to represent artifact, however if there is high clinical suspicion for lateral tibial plateau fracture follow-up with CT is recommended. 3. End-stage degenerative changes of the left knee. 4. Small suprapatellar joint effusion. Electronically Signed   By: Constance Holster M.D.   On: 01/30/2019 00:09    Procedures Procedures  Medications Ordered in ED Medications  oxyCODONE-acetaminophen (PERCOCET/ROXICET) 5-325 MG per tablet 1 tablet (1 tablet Oral Given 01/30/19 0035)     Initial Impression / Assessment and Plan / ED Course  I have reviewed the triage vital signs and the nursing notes.  Pertinent labs & imaging results that were available during my care of the patient were reviewed by me and considered in my medical decision making (see chart for details).  Injury to left knee.  X-rays show extensive degenerative changes and  difficult to assess.  Radiologist mentions subtle lucency which may be artifact.  Will send for CT scan to evaluate further.  CT scan shows nondisplaced fracture of the medial tibial plateau.  Attempted to place an knee immobilizer on her, but he did not have one that would fit her.  She is sent home with prescriptions for knee immobilizer and a walker.  Given prescription for oxycodone-acetaminophen for pain.  Referred to orthopedics for follow-up.  Final Clinical Impressions(s) / ED Diagnoses   Final diagnoses:  Closed fracture of medial portion of left tibial plateau, initial encounter    ED Discharge Orders         Ordered    oxyCODONE-acetaminophen (PERCOCET) 5-325 MG tablet  Every 4 hours PRN     01/30/19 0232    oxyCODONE-acetaminophen (PERCOCET) 5-325 MG tablet  Every 4 hours PRN     01/30/19 7673           Delora Fuel, MD 41/93/79 [phone]

## 2019-01-30 NOTE — ED Notes (Signed)
Pt wheeled to waiting room. Pt verbalized understanding of discharge instructions.   

## 2019-01-30 NOTE — ED Notes (Signed)
Knee immobilizer did not fit patient properly. MD notified. Pt given prescription for knee immobilizer.

## 2019-01-30 NOTE — Discharge Instructions (Addendum)
Apply ice, keep leg elevated as much as possible.  No weight bearing.  See the orthopedic doctor as soon as possible.

## 2019-02-01 MED FILL — Oxycodone w/ Acetaminophen Tab 5-325 MG: ORAL | Qty: 6 | Status: AC

## 2019-02-02 ENCOUNTER — Other Ambulatory Visit: Payer: Self-pay

## 2019-02-02 ENCOUNTER — Ambulatory Visit (INDEPENDENT_AMBULATORY_CARE_PROVIDER_SITE_OTHER): Payer: 59 | Admitting: Orthopedic Surgery

## 2019-02-02 ENCOUNTER — Encounter: Payer: Self-pay | Admitting: Orthopedic Surgery

## 2019-02-02 VITALS — BP 130/77 | HR 78 | Temp 97.5°F | Ht 59.0 in | Wt 366.0 lb

## 2019-02-02 DIAGNOSIS — S82142A Displaced bicondylar fracture of left tibia, initial encounter for closed fracture: Secondary | ICD-10-CM

## 2019-02-02 MED ORDER — OXYCODONE-ACETAMINOPHEN 10-325 MG PO TABS: 1.0000 | ORAL_TABLET | ORAL | 0 refills | Status: AC | PRN

## 2019-02-02 NOTE — Progress Notes (Signed)
NEW PROBLEM OFFICE VISIT  Chief Complaint  Patient presents with  . Fracture    Lt knee DOI 01/29/19    53 year old female fell getting into her car at a cookout injured her left knee and sustained a posterior old medial tibial plateau fracture with an underlying osteoarthritis and significant neuropathy being followed at [facility] with genicular nerve blocks.  Declares her pain is 10 out of 10 but she is not been on her oral opioids for several days  Pain location left knee Quality dull Severity 10 Duration May 23   Review of Systems  Musculoskeletal: Positive for back pain, falls and joint pain.  Neurological: Positive for sensory change. Negative for weakness.     Past Medical History:  Diagnosis Date  . Arthritis   . Gout   . Hypertension   . Neuropathy     Past Surgical History:  Procedure Laterality Date  . ANKLE SURGERY    . TUBAL LIGATION      History reviewed. No pertinent family history. Social History   Tobacco Use  . Smoking status: Never Smoker  . Smokeless tobacco: Never Used  Substance Use Topics  . Alcohol use: Never    Frequency: Never  . Drug use: Never    No Known Allergies  Current Meds  Medication Sig  . gabapentin (NEURONTIN) 100 MG capsule Take 100 mg by mouth 3 (three) times daily.  . traMADol (ULTRAM) 50 MG tablet Take by mouth every 6 (six) hours as needed.    BP 130/77   Pulse 78   Temp (!) 97.5 F (36.4 C)   Ht 4\' 11"  (1.499 m)   Wt (!) 366 lb (166 kg)   BMI 73.92 kg/m   Physical Exam Vitals signs and nursing note reviewed.  Constitutional:      Appearance: Normal appearance.  Neurological:     Mental Status: She is alert and oriented to person, place, and time.  Psychiatric:        Mood and Affect: Mood normal.     Ortho Exam  Since the injury she has been confined basically to the wheelchair  Obesity prevents routine examination of the knee however we were able to elicit tenderness in the posterior medial knee  joint with a range of motion of 90 degrees and no instability muscle tone was normal no atrophy skin was intact  She had excessive edema bilaterally which was chronic.  She felt pressure pain and soft touch around the joint  Coordination and balance could not be tested in lower extremity secondary to fracture and obesity there were no pathologic reflexes  The right lower extremity although obese maintained its range of motion and stability and strength in the knee  [place] were done at [facility] this included plain films and CT scan  My independent reading of xrays:   4 views of the left knee she has a major varus deformity of 16 degrees with depression of the medial plateau from arthritis joint space narrowing severe osteophyte formation around the joint all 3 compartments moderate  Osteoarthritis with possible fracture  CT scan posterior medial tibial plateau fracture with osteoarthritis all 3 compartments  CT scan report IMPRESSION: 1. Examination limited by patient body habitus. 2. Nondisplaced fracture involving the posteromedial tibial plateau/condyle. 3. Small hyperdense joint effusion consistent with hemarthrosis. 4. Tricompartmental end-stage degenerative changes.     Electronically Signed   By: Constance Holster M.D.   On: 01/30/2019 01:22  Encounter Diagnosis  Name Primary?  . Closed fracture of left tibial plateau, initial encounter Yes  The patient meets the AMA guidelines for Morbid (severe) obesity with a BMI > 40.0 and I have recommended weight loss.   PLAN: (Rx., injectx, surgery, frx, mri/ct) The patient has morbid obesity with a BMI estimated 73 Cannot cast or brace recommend walker X-ray 4 weeks  Meds ordered this encounter  Medications  . oxyCODONE-acetaminophen (PERCOCET) 10-325 MG tablet    Sig: Take 1 tablet by mouth every 4 (four) hours as needed for up to 7 days for pain.    Dispense:  42 tablet    Refill:   0   The patient is advised to get her next prescription from her pain management specialist Arther Abbott, MD  02/02/2019 11:41 AM

## 2019-02-24 DIAGNOSIS — S82142A Displaced bicondylar fracture of left tibia, initial encounter for closed fracture: Secondary | ICD-10-CM | POA: Insufficient documentation

## 2019-02-28 ENCOUNTER — Ambulatory Visit (INDEPENDENT_AMBULATORY_CARE_PROVIDER_SITE_OTHER): Payer: 59 | Admitting: Orthopedic Surgery

## 2019-02-28 ENCOUNTER — Encounter: Payer: Self-pay | Admitting: Orthopedic Surgery

## 2019-02-28 ENCOUNTER — Other Ambulatory Visit: Payer: Self-pay

## 2019-02-28 ENCOUNTER — Ambulatory Visit (INDEPENDENT_AMBULATORY_CARE_PROVIDER_SITE_OTHER): Payer: 59

## 2019-02-28 DIAGNOSIS — S82142D Displaced bicondylar fracture of left tibia, subsequent encounter for closed fracture with routine healing: Secondary | ICD-10-CM

## 2019-02-28 NOTE — Progress Notes (Signed)
Patient ID: Gwendolyn Gutierrez, female   DOB: 1966/04/03, 53 y.o.   MRN: [id number]  FRACTURE CARE   Chief Complaint  Patient presents with  . Knee Injury    01/29/19 left tibial plateau     Encounter Diagnosis  Name Primary?  . Closed fracture of left tibial plateau with routine healing, subsequent encounter 01/29/19     CURRENT TREATMENT : non operative no brace will fill   POST INJURY DAY: 30  GLOBAL PERIOD DAY 26/90  Patient says her left knee is improving.  Her x-ray looks good she can flex extend the knee 0 to 90 degrees  Weight-bear as tolerated x-ray in 6 weeks

## 2019-04-11 ENCOUNTER — Ambulatory Visit: Payer: 59

## 2019-04-11 ENCOUNTER — Ambulatory Visit (HOSPITAL_COMMUNITY)
Admission: RE | Admit: 2019-04-11 | Discharge: 2019-04-11 | Disposition: A | Discharge status: Home / Self Care | Payer: 59 | Source: Ambulatory Visit | Attending: Orthopedic Surgery | Admitting: Orthopedic Surgery

## 2019-04-11 ENCOUNTER — Encounter: Payer: Self-pay | Admitting: Orthopedic Surgery

## 2019-04-11 ENCOUNTER — Ambulatory Visit (INDEPENDENT_AMBULATORY_CARE_PROVIDER_SITE_OTHER): Payer: 59 | Admitting: Orthopedic Surgery

## 2019-04-11 ENCOUNTER — Other Ambulatory Visit: Payer: Self-pay

## 2019-04-11 VITALS — Temp 97.3°F

## 2019-04-11 DIAGNOSIS — S82142D Displaced bicondylar fracture of left tibia, subsequent encounter for closed fracture with routine healing: Secondary | ICD-10-CM

## 2019-04-15 ENCOUNTER — Telehealth: Payer: Self-pay | Admitting: Orthopedic Surgery

## 2019-04-15 NOTE — Telephone Encounter (Signed)
Xray: fracture has healed   She wasn't answering and couldn't leave message

## 2019-04-18 ENCOUNTER — Telehealth: Payer: Self-pay

## 2019-04-18 NOTE — Telephone Encounter (Signed)
FINDINGS: Advanced tricompartment degenerative changes with joint space loss and spurring, most pronounced in the medial compartment. Previously seen posteromedial tibial plateau on CT not appreciable by plain film. No visible joint effusion or acute findings.  IMPRESSION: Previously seen posteromedial tibial plateau fracture not visible by plain film. Advanced osteoarthritis.

## 2019-04-18 NOTE — Telephone Encounter (Signed)
Patient left voicemail message wanting to know the results of her X-rays at [facility]??  Please call and advise

## 2019-04-20 ENCOUNTER — Telehealth: Payer: Self-pay | Admitting: Radiology

## 2019-04-20 DIAGNOSIS — M1712 Unilateral primary osteoarthritis, left knee: Secondary | ICD-10-CM

## 2019-04-20 NOTE — Telephone Encounter (Signed)
Brace order is on my desk, from Dr Aline Brochure, she will take order to a place in [place] therapy for her in [place], per Dr Aline Brochure

## 2019-04-20 NOTE — Telephone Encounter (Signed)
Cant walk very sore  Unstable   rec therapy   Add therapy in [place]    CLINICAL DATA:  Closed fracture of left knee   EXAM: LEFT KNEE - 1-2 VIEW   COMPARISON:  02/28/2019, 01/30/2019   FINDINGS: Advanced tricompartment degenerative changes with joint space loss and spurring, most pronounced in the medial compartment. Previously seen posteromedial tibial plateau on CT not appreciable by plain film. No visible joint effusion or acute findings.   IMPRESSION: Previously seen posteromedial tibial plateau fracture not visible by plain film. Advanced osteoarthritis.     Electronically Signed   By: Rolm Baptise M.D.   On: 04/11/2019 21:00

## 2019-12-13 DIAGNOSIS — I10 Essential (primary) hypertension: Secondary | ICD-10-CM | POA: Insufficient documentation

## 2019-12-13 DIAGNOSIS — Z79891 Long term (current) use of opiate analgesic: Secondary | ICD-10-CM | POA: Insufficient documentation

## 2020-07-21 ENCOUNTER — Emergency Department (HOSPITAL_COMMUNITY)
Admission: EM | Admit: 2020-07-21 | Discharge: 2020-07-21 | Disposition: A | Discharge status: Home / Self Care | Payer: 59 | Attending: Emergency Medicine | Admitting: Emergency Medicine

## 2020-07-21 ENCOUNTER — Other Ambulatory Visit: Payer: Self-pay

## 2020-07-21 ENCOUNTER — Encounter (HOSPITAL_COMMUNITY): Payer: Self-pay | Admitting: *Deleted

## 2020-07-21 ENCOUNTER — Emergency Department (HOSPITAL_COMMUNITY): Payer: 59

## 2020-07-21 DIAGNOSIS — N390 Urinary tract infection, site not specified: Secondary | ICD-10-CM | POA: Diagnosis present

## 2020-07-21 DIAGNOSIS — N12 Tubulo-interstitial nephritis, not specified as acute or chronic: Secondary | ICD-10-CM | POA: Diagnosis not present

## 2020-07-21 DIAGNOSIS — I10 Essential (primary) hypertension: Secondary | ICD-10-CM | POA: Diagnosis not present

## 2020-07-21 DIAGNOSIS — R1032 Left lower quadrant pain: Secondary | ICD-10-CM | POA: Insufficient documentation

## 2020-07-21 DIAGNOSIS — Z79899 Other long term (current) drug therapy: Secondary | ICD-10-CM | POA: Diagnosis not present

## 2020-07-21 LAB — CBC WITH DIFFERENTIAL/PLATELET
Abs Immature Granulocytes: 0.01 10*3/uL (ref 0.00–0.07)
Basophils Absolute: 0 10*3/uL (ref 0.0–0.1)
Basophils Relative: 1 %
Eosinophils Absolute: 0.1 10*3/uL (ref 0.0–0.5)
Eosinophils Relative: 2 %
HCT: 48.1 % — ABNORMAL HIGH (ref 36.0–46.0)
Hemoglobin: 14.7 g/dL (ref 12.0–15.0)
Immature Granulocytes: 0 %
Lymphocytes Relative: 33 %
Lymphs Abs: 2.2 10*3/uL (ref 0.7–4.0)
MCH: 27.8 pg (ref 26.0–34.0)
MCHC: 30.6 g/dL (ref 30.0–36.0)
MCV: 91.1 fL (ref 80.0–100.0)
Monocytes Absolute: 0.5 10*3/uL (ref 0.1–1.0)
Monocytes Relative: 7 %
Neutro Abs: 3.9 10*3/uL (ref 1.7–7.7)
Neutrophils Relative %: 57 %
Platelets: 311 10*3/uL (ref 150–400)
RBC: 5.28 MIL/uL — ABNORMAL HIGH (ref 3.87–5.11)
RDW: 14.6 % (ref 11.5–15.5)
WBC: 6.8 10*3/uL (ref 4.0–10.5)
nRBC: 0 % (ref 0.0–0.2)

## 2020-07-21 LAB — URINALYSIS, ROUTINE W REFLEX MICROSCOPIC
Bilirubin Urine: NEGATIVE
Glucose, UA: NEGATIVE mg/dL
Hgb urine dipstick: NEGATIVE
Ketones, ur: NEGATIVE mg/dL
Leukocytes,Ua: NEGATIVE
Nitrite: POSITIVE — AB
Protein, ur: NEGATIVE mg/dL
Specific Gravity, Urine: 1.023 (ref 1.005–1.030)
pH: 5 (ref 5.0–8.0)

## 2020-07-21 LAB — BASIC METABOLIC PANEL
Anion gap: 9 (ref 5–15)
BUN: 18 mg/dL (ref 6–20)
CO2: 24 mmol/L (ref 22–32)
Calcium: 9.1 mg/dL (ref 8.9–10.3)
Chloride: 105 mmol/L (ref 98–111)
Creatinine, Ser: 0.93 mg/dL (ref 0.44–1.00)
GFR, Estimated: >60 mL/min (ref >60)
Glucose, Bld: 99 mg/dL (ref 70–99)
Potassium: 4 mmol/L (ref 3.5–5.1)
Sodium: 138 mmol/L (ref 135–145)

## 2020-07-21 MED ORDER — MORPHINE SULFATE (PF) 4 MG/ML IV SOLN
4.0000 mg | Freq: Once | INTRAVENOUS | Status: AC
  Administered 2020-07-21: 4 mg via INTRAVENOUS
  Filled 2020-07-21: qty 1

## 2020-07-21 MED ORDER — SODIUM CHLORIDE 0.9 % IV SOLN
1.0000 g | Freq: Once | INTRAVENOUS | Status: AC
  Administered 2020-07-21: 1 g via INTRAVENOUS
  Filled 2020-07-21: qty 10

## 2020-07-21 MED ORDER — IOHEXOL 300 MG/ML  SOLN
100.0000 mL | Freq: Once | INTRAMUSCULAR | Status: AC | PRN
  Administered 2020-07-21: 100 mL via INTRAVENOUS

## 2020-07-21 MED ORDER — SODIUM CHLORIDE 0.9 % IV BOLUS
1000.0000 mL | Freq: Once | INTRAVENOUS | Status: AC
  Administered 2020-07-21: 1000 mL via INTRAVENOUS

## 2020-07-21 MED ORDER — CEPHALEXIN 500 MG PO CAPS: 500.0000 mg | ORAL_CAPSULE | Freq: Three times a day (TID) | ORAL | 0 refills | Status: AC

## 2020-07-21 MED ORDER — NAPROXEN 500 MG PO TABS: 500.0000 mg | ORAL_TABLET | Freq: Two times a day (BID) | ORAL | 0 refills | Status: DC

## 2020-07-21 MED ORDER — ONDANSETRON HCL 4 MG/2ML IJ SOLN
4.0000 mg | Freq: Once | INTRAMUSCULAR | Status: AC
  Administered 2020-07-21: 4 mg via INTRAVENOUS
  Filled 2020-07-21: qty 2

## 2020-07-21 NOTE — ED Notes (Signed)
Seen at [facility] in [place] for UTI  Given antibiotics   No better   Here for eval   Pt also has fx leg and needs wheel chair

## 2020-07-21 NOTE — ED Notes (Signed)
Pt is morbidly obese  Unable to get accurate weight due to fx leg  Order for IV  No access noted by this RN  Call to SWOT to see if they can assist with IV

## 2020-07-21 NOTE — Discharge Instructions (Signed)
Continue taking your home pain medications.  I have also added on anti-inflammatory.  Take the antibiotics as prescribed.  Return for new or worsening symptoms.

## 2020-07-21 NOTE — ED Notes (Signed)
From CT 

## 2020-07-21 NOTE — ED Triage Notes (Signed)
Pt seen Urgent Care in [place] and was prescribed antibiotics, was instructed if not any better in 3 days to get seen. Pt on Phenazopyridine 200 mg and nitrofurantion 100 mg.  Pt with throbbing pain after urination and left side abd pain.  Pt denies any fevers or N/V.

## 2020-07-21 NOTE — ED Notes (Signed)
VS delayed due to SWOT at bedside attempting IV access

## 2020-07-21 NOTE — ED Notes (Signed)
Pt transported to CT ?

## 2020-07-21 NOTE — ED Notes (Signed)
SWOT attempted IV x 3   UA to stick   Heather RN in to attempt

## 2020-07-21 NOTE — ED Provider Notes (Signed)
[facility] EMERGENCY DEPARTMENT Provider Note   CSN: [id number] Arrival date & time: 07/21/20  1218    History Chief Complaint  Patient presents with  . Urinary Tract Infection    Gwendolyn Gutierrez is a 54 y.o. female with past medical history significant for hypertension, gout who presents for evaluation of left flank pain and dysuria. Seen by urgent care 3 days ago started on Macrobid as well as Pyridium. States her symptoms are no better and they are actually worse. Gwendolyn Gutierrez has been taking her home tramadol and Norco without relief of her pain. No prior history of stones. Feels like a constant, dull and nagging aching sensation to her left flank. States Gwendolyn Gutierrez feels like Gwendolyn Gutierrez has a spasms to her urethra when Gwendolyn Gutierrez goes to urinate. Gwendolyn Gutierrez has not noted any hematuria. Denies any pelvic pain, vaginal discharge or concerns for STDs. No fever, chills, nausea, vomiting, chest pain, shortness of breath, weakness. Denies additional aggravating or alleviating factors.  History obtained from patient and past medical records. No interpreter is used.   HPI     Past Medical History:  Diagnosis Date  . Arthritis   . Gout   . Hypertension   . Neuropathy     Patient Active Problem List   Diagnosis Date Noted  . Tibial plateau fracture, left 01/29/19 02/24/2019  . Gout 12/20/2016  . Osteoarthritis of both knees 06/19/2016  . Elevated C-reactive protein 01/07/2016  . Primary osteoarthritis of both ankles 01/07/2016  . Vitamin D deficiency 01/07/2016  . Chronic pain of both ankles 12/17/2015  . Numbness and tingling of both legs below knees 12/17/2015  . Chronic pain of right ankle 05/25/2014  . High risk medication use 05/25/2014  . Knee pain, left anterior 05/25/2014  . Morbid obesity with BMI of 60.0-69.9, adult ([place]) 05/25/2014  . Osteoarthritis of left knee 04/05/2014    Past Surgical History:  Procedure Laterality Date  . ANKLE SURGERY    . TUBAL LIGATION       OB History   No  obstetric history on file.     History reviewed. No pertinent family history.  Social History   Tobacco Use  . Smoking status: Never Smoker  . Smokeless tobacco: Never Used  Substance Use Topics  . Alcohol use: Never  . Drug use: Never    Home Medications Prior to Admission medications   Medication Sig Start Date End Date Taking? Authorizing Provider  amLODipine (NORVASC) 10 MG tablet  01/17/19   [provider]  cephALEXin (KEFLEX) 500 MG capsule Take 1 capsule (500 mg total) by mouth 3 (three) times daily for 7 days. 07/21/20 07/28/20  Mayerli Kirst A, PA-C  DULoxetine (CYMBALTA) 60 MG capsule  05/06/18   [provider]  furosemide (LASIX) 20 MG tablet  01/11/19   [provider]  gabapentin (NEURONTIN) 600 MG tablet  03/08/19   [provider]  hydrochlorothiazide (HYDRODIURIL) 25 MG tablet Take by mouth.    [provider]  naproxen (NAPROSYN) 500 MG tablet Take 1 tablet (500 mg total) by mouth 2 (two) times daily. 07/21/20   Lorraina Spring A, PA-C  oxyCODONE-acetaminophen (PERCOCET) 10-325 MG tablet  02/24/19   [provider]  traMADol (ULTRAM) 50 MG tablet Take by mouth every 6 (six) hours as needed.    [provider]    Allergies    Patient has no known allergies.  Review of Systems   Review of Systems  Constitutional: Negative.   HENT: Negative.  Eyes: Negative.   Respiratory: Negative.   Cardiovascular: Negative.   Gastrointestinal: Negative.   Genitourinary: Positive for dysuria, flank pain, frequency, menstrual problem and urgency. Negative for decreased urine volume, hematuria, pelvic pain, vaginal bleeding, vaginal discharge and vaginal pain.  Skin: Negative.   Neurological: Negative.   All other systems reviewed and are negative.   Physical Exam Updated Vital Signs BP 124/67   Pulse 71   Temp (!) 97.5 F (36.4 C) (Oral)   Resp 20   Ht 4\' 11"  (1.499 m)   Wt (!) 168.3 kg   SpO2 100%    BMI 74.93 kg/m   Physical Exam Vitals and nursing note reviewed.  Constitutional:      General: Gwendolyn Gutierrez is not in acute distress.    Appearance: Gwendolyn Gutierrez is well-developed. Gwendolyn Gutierrez is not ill-appearing, toxic-appearing or diaphoretic.  HENT:     Head: Normocephalic and atraumatic.     Nose: Nose normal.     Mouth/Throat:     Mouth: Mucous membranes are moist.  Eyes:     Pupils: Pupils are equal, round, and reactive to light.  Cardiovascular:     Rate and Rhythm: Normal rate.     Pulses: Normal pulses.          Radial pulses are 2+ on the right side and 2+ on the left side.       Dorsalis pedis pulses are 2+ on the right side and 2+ on the left side.     Heart sounds: Normal heart sounds.  Pulmonary:     Effort: Pulmonary effort is normal. No respiratory distress.     Breath sounds: Normal breath sounds.  Abdominal:     General: Bowel sounds are normal. There is no distension.     Palpations: Abdomen is soft.     Tenderness: There is abdominal tenderness. There is right CVA tenderness and left CVA tenderness. There is no guarding or rebound. Negative signs include Murphy's sign and McBurney's sign.     Hernia: No hernia is present.       Comments: Limited exam due to body habitus. Mild tenderness to suprapubic region, positive CVA tap on left.   Musculoskeletal:        General: Normal range of motion.     Cervical back: Normal range of motion.       Back:     Comments: No midline spinal tenderness, crepitus or step-offs  Skin:    General: Skin is warm and dry.     Capillary Refill: Capillary refill takes less than 2 seconds.     Comments: No edema, erythema or warmth Tactile temperature to extremities  Neurological:     General: No focal deficit present.     Mental Status: Gwendolyn Gutierrez is alert and oriented to person, place, and time.     Comments: Cranial nerves II to XII grossly intact Intact sensation Ambulatory with out difficulty     ED Results / Procedures / Treatments    Labs (all labs ordered are listed, but only abnormal results are displayed) Labs Reviewed  CBC WITH DIFFERENTIAL/PLATELET - Abnormal; Notable for the following components:      Result Value   RBC 5.28 (*)    HCT 48.1 (*)    All other components within normal limits  URINALYSIS, ROUTINE W REFLEX MICROSCOPIC - Abnormal; Notable for the following components:   Color, Urine AMBER (*)    APPearance HAZY (*)    Nitrite POSITIVE (*)    Bacteria, UA FEW (*)  All other components within normal limits  URINE CULTURE  BASIC METABOLIC PANEL    EKG None  Radiology CT Abdomen Pelvis W Contrast  Result Date: 07/21/2020 CLINICAL DATA:  Left lower quadrant abdominal pain, dysuria EXAM: CT ABDOMEN AND PELVIS WITH CONTRAST TECHNIQUE: Multidetector CT imaging of the abdomen and pelvis was performed using the standard protocol following bolus administration of intravenous contrast. CONTRAST:  116mL OMNIPAQUE IOHEXOL 300 MG/ML  SOLN COMPARISON:  None. FINDINGS: Lower chest: No acute pleural or parenchymal lung disease. Hepatobiliary: No focal liver abnormality is seen. No gallstones, gallbladder wall thickening, or biliary dilatation. Pancreas: Unremarkable. No pancreatic ductal dilatation or surrounding inflammatory changes. Spleen: Normal in size without focal abnormality. Adrenals/Urinary Tract: Adrenal glands are unremarkable. Kidneys are normal, without renal calculi, focal lesion, or hydronephrosis. Bladder is unremarkable. Stomach/Bowel: No bowel obstruction or ileus. Normal appendix right lower quadrant. No bowel wall thickening or inflammatory change. Vascular/Lymphatic: No significant vascular findings are present. No enlarged abdominal or pelvic lymph nodes. Reproductive: Uterus and bilateral adnexa are unremarkable. Other: No free fluid or free gas.  No abdominal wall hernia. Musculoskeletal: No acute or destructive bony lesions. Mild anterolisthesis of L4 on L5. Extensive facet hypertrophy and  spondylosis at the lumbosacral junction. Reconstructed images demonstrate no additional findings. IMPRESSION: 1. No acute intra-abdominal or intrapelvic process. Electronically Signed   By: Randa Ngo M.D.   On: 07/21/2020 17:00    Procedures Procedures (including critical care time)  Medications Ordered in ED Medications  sodium chloride 0.9 % bolus 1,000 mL (1,000 mLs Intravenous New Bag/Given 07/21/20 1624)  morphine 4 MG/ML injection 4 mg (4 mg Intravenous Given 07/21/20 1628)  ondansetron (ZOFRAN) injection 4 mg (4 mg Intravenous Given 07/21/20 1627)  cefTRIAXone (ROCEPHIN) 1 g in sodium chloride 0.9 % 100 mL IVPB (1 g Intravenous New Bag/Given 07/21/20 1702)  iohexol (OMNIPAQUE) 300 MG/ML solution 100 mL (100 mLs Intravenous Contrast Given 07/21/20 1636)    ED Course  I have reviewed the triage vital signs and the nursing notes.  Pertinent labs & imaging results that were available during my care of the patient were reviewed by me and considered in my medical decision making (see chart for details).  54 year old presents for evaluation of urinary complaints and left flank pain. Started on Macrobid by urgent care 3 days ago without relief of symptoms. On arrival Gwendolyn Gutierrez is afebrile, nonseptic, not ill-appearing. Heart and lungs clear. Abdomen exam limited due to patient's body habitus. Gwendolyn Gutierrez is diffusely tender to her suprapubic region with positive CVA tap on left. Neurovascularly intact. We will plan on labs, imaging, treat pain and reassess  Labs and imaging personally reviewed and interpreted:  CBC without leukocytosis, hemoglobin stable Metabolic panel without electrolyte, renal or liver normality UA positive for nitrates and bacteria CT abdomen pelvis without acute pathology  Patient reassessed. Pain controlled. Gwendolyn Gutierrez has gotten Rocephin for her UTI as well as IV fluids and pain management. Gwendolyn Gutierrez is tolerating p.o. intake without difficulty. Will DC home with Keflex,  anti-inflammatories. Patient takes Norco at home for chronic pain.  Patient is nontoxic, nonseptic appearing, in no apparent distress.  Patient's pain and other symptoms adequately managed in emergency department.  Fluid bolus given.  Labs, imaging and vitals reviewed.  Patient does not meet the SIRS or Sepsis criteria.  On repeat exam patient does not have a surgical abdomin and there are no peritoneal signs.  No indication of appendicitis, bowel obstruction, bowel perforation, cholecystitis, diverticulitis, AAA, dissection, Torsion, TOA, PID or  ectopic pregnancy.    The patient has been appropriately medically screened and/or stabilized in the ED. I have low suspicion for any other emergent medical condition which would require further screening, evaluation or treatment in the ED or require inpatient management.  Patient is hemodynamically stable and in no acute distress.  Patient able to ambulate in department prior to ED.  Evaluation does not show acute pathology that would require ongoing or additional emergent interventions while in the emergency department or further inpatient treatment.  I have discussed the diagnosis with the patient and answered all questions.  Pain is been managed while in the emergency department and patient has no further complaints prior to discharge.  Patient is comfortable with plan discussed in room and is stable for discharge at this time.  I have discussed strict return precautions for returning to the emergency department.  Patient was encouraged to follow-up with PCP/specialist refer to at discharge.    MDM Rules/Calculators/A&P                           Final Clinical Impression(s) / ED Diagnoses Final diagnoses:  Pyelonephritis    Rx / DC Orders ED Discharge Orders         Ordered    cephALEXin (KEFLEX) 500 MG capsule  3 times daily        07/21/20 1747    naproxen (NAPROSYN) 500 MG tablet  2 times daily        07/21/20 1747           Helyne Genther,  Caileigh Canche A, PA-C 07/21/20 1759    Lajean Saver, MD 07/21/20 2126

## 2020-07-24 LAB — URINE CULTURE: Culture: 20000 — AB

## 2020-07-25 ENCOUNTER — Telehealth: Payer: Self-pay | Admitting: *Deleted

## 2020-07-25 NOTE — Telephone Encounter (Signed)
Post ED Visit - Positive Culture Follow-up  Culture report reviewed by antimicrobial stewardship pharmacist: [place] Team []  Elenor Quinones, Pharm.D. []  Heide Guile, Pharm.D., BCPS AQ-ID []  Parks Neptune, Pharm.D., BCPS []  Alycia Rossetti, Pharm.D., BCPS []  [place], Pharm.D., BCPS, AAHIVP []  Legrand Como, Pharm.D., BCPS, AAHIVP []  Salome Arnt, PharmD, BCPS []  Johnnette Gourd, PharmD, BCPS []  Hughes Better, PharmD, BCPS []  Leeroy Cha, PharmD []  Laqueta Linden, PharmD, BCPS [x]  Albertina Parr, PharmD  [place] Team []  Leodis Sias, PharmD []  Lindell Spar, PharmD []  Royetta Asal, PharmD []  Graylin Shiver, Rph []  Rema Fendt) Glennon Mac, PharmD []  Arlyn Dunning, PharmD []  Netta Cedars, PharmD []  Dia Sitter, PharmD []  Leone Haven, PharmD []  Gretta Arab, PharmD []  Theodis Shove, PharmD []  Peggyann Juba, PharmD []  Reuel Boom, PharmD   Positive urine culture Treated with Cephalexin, organism sensitive to the same and no further patient follow-up is required at this time.  Harlon Flor [facility] 07/25/2020, 12:06 PM

## 2021-01-14 ENCOUNTER — Encounter: Payer: Self-pay | Admitting: Orthopedic Surgery

## 2021-01-14 ENCOUNTER — Ambulatory Visit: Payer: 59

## 2021-01-14 ENCOUNTER — Ambulatory Visit (INDEPENDENT_AMBULATORY_CARE_PROVIDER_SITE_OTHER): Payer: 59 | Admitting: Orthopedic Surgery

## 2021-01-14 ENCOUNTER — Other Ambulatory Visit: Payer: Self-pay

## 2021-01-14 VITALS — BP 148/93 | HR 93 | Ht 59.0 in | Wt 380.0 lb

## 2021-01-14 DIAGNOSIS — R262 Difficulty in walking, not elsewhere classified: Secondary | ICD-10-CM | POA: Insufficient documentation

## 2021-01-14 DIAGNOSIS — Z6841 Body Mass Index (BMI) 40.0 and over, adult: Secondary | ICD-10-CM

## 2021-01-14 DIAGNOSIS — G629 Polyneuropathy, unspecified: Secondary | ICD-10-CM | POA: Insufficient documentation

## 2021-01-14 DIAGNOSIS — M7541 Impingement syndrome of right shoulder: Secondary | ICD-10-CM | POA: Diagnosis not present

## 2021-01-14 DIAGNOSIS — G609 Hereditary and idiopathic neuropathy, unspecified: Secondary | ICD-10-CM | POA: Insufficient documentation

## 2021-01-14 DIAGNOSIS — M25511 Pain in right shoulder: Secondary | ICD-10-CM

## 2021-01-14 NOTE — Progress Notes (Signed)
NEW PROBLEM//OFFICE VISIT  Summary assessment and plan:   55 year old female with BMI approaching 70 chronic shoulder pain recently worsened appears to have bursitis although I could not test her rotator cuff in a reasonable manner secondary to pain and loss of motion  Recommend injection  Physical therapy  Return for reexamination 8 weeks  No orders of the defined types were placed in this encounter.    Chief Complaint  Patient presents with  . Shoulder Pain    R/ problems lifting/ hurts/ have good days and bad ones. Went to Urgent Care in [place] about 3 weeks ago, they didn't do any xrays.    55 year old female 2-year history of pain right shoulder after she was lifting something and felt a given her right shoulder.  She never improved and then over the last 2 to 3 weeks ago pain worsened her motion decreased and she presents with chronic with acute on chronic right shoulder pain and loss of motion   Review of Systems  Constitutional: Positive for diaphoresis, malaise/fatigue and weight loss.  Cardiovascular: Positive for claudication and leg swelling.  Musculoskeletal: Positive for joint pain and myalgias.  All other systems reviewed and are negative.  Note patient had a tibial plateau fracture.  At the time of the plateau fracture she was walking with a walker since that time she cannot really ambulate with a walker in the standing position and just uses it to wheel herself around  She has osteoarthritis varus knee BMI near 70 not a surgical candidate without bariatric surgery  Past Medical History:  Diagnosis Date  . Arthritis   . Gout   . Hypertension   . Neuropathy     Past Surgical History:  Procedure Laterality Date  . ANKLE SURGERY    . TUBAL LIGATION      No family history on file. Social History   Tobacco Use  . Smoking status: Never Smoker  . Smokeless tobacco: Never Used  Substance Use Topics  . Alcohol use: Never  . Drug use: Never    No  Known Allergies  No outpatient medications have been marked as taking for the 01/14/21 encounter (Office Visit) with Carole Civil, MD.    BP (!) 148/93   Pulse 93   Ht 4\' 11"  (1.499 m)   Wt (!) 380 lb (172.4 kg)   BMI 76.75 kg/m   Physical Exam  General appearance: Well-developed well-nourished no gross deformities  Cardiovascular normal pulse and perfusion normal color without edema  Neurologically o sensation loss or deficits or pathologic reflexes  Psychological: Awake alert and oriented x3 mood and affect normal  Skin no lacerations or ulcerations no nodularity no palpable masses, no erythema or nodularity  Musculoskeletal:   Right shoulder passive motion 0-90 in the scapular plane 0-90 abduction active abduction 80 degrees all painful  Internal/external rotation was normal with the arm at her side  Shoulder cuff strength could not be tested except for internal/external rotation which was normal    MEDICAL DECISION MAKING  A.  Encounter Diagnosis  Name Primary?  . Acute pain of right shoulder Yes    B. DATA ANALYSED:   IMAGING: Interpretation of images: X-rays done in the office show mild glenohumeral arthritis with an inferior spur but normal space in the joint  Orders: Physical therapy and a referral was made to [facility] further bariatric clinic to see if she can have bariatric surgery and then have her knee replacement for her chronic osteoarthritis of the left  knee with prior plateau fracture  Outside records reviewed: None   C. MANAGEMENT    Procedure note the subacromial injection shoulder RIGHT    Verbal consent was obtained to inject the  RIGHT   Shoulder  Timeout was completed to confirm the injection site is a subacromial space of the  RIGHT  shoulder   Medication used celestone 6mg  and lidocaine 1% 3 cc  Anesthesia was provided by ethyl chloride  The injection was performed in the RIGHT  posterior subacromial space. After pinning  the skin with alcohol and anesthetized the skin with ethyl chloride the subacromial space was injected using a 20-gauge needle. There were no complications  Sterile dressing was applied.    No orders of the defined types were placed in this encounter.     Arther Abbott, MD  01/14/2021 2:45 PM

## 2021-01-14 NOTE — Patient Instructions (Addendum)
You have received an injection of steroids into the joint. 15% of patients will have increased pain within the 24 hours postinjection.   This is transient and will go away.   We recommend that you use ice packs on the injection site for 20 minutes every 2 hours and extra strength Tylenol 2 tablets every 8 as needed until the pain resolves.  If you continue to have pain after taking the Tylenol and using the ice please call the office for further instructions.   Shoulder Impingement Syndrome  Shoulder impingement syndrome is a condition that causes pain when connective tissues (tendons) surrounding the shoulder joint become pinched. These tendons are part of the group of muscles and tissues that help to stabilize the shoulder (rotator cuff). Beneath the rotator cuff is a fluid-filled sac (bursa) that allows the muscles and tendons to glide smoothly. The bursa may become swollen or irritated (bursitis). Bursitis, swelling in the rotator cuff tendons, or both conditions can decrease how much space is under a bone in the shoulder joint (acromion), resulting in impingement. What are the causes? Shoulder impingement syndrome may be caused by bursitis or swelling of the rotator cuff tendons, which may result from:  Repetitive overhead arm movements.  Falling onto the shoulder.  Weakness in the shoulder muscles. What increases the risk? You may be more likely to develop this condition if you:  Play sports that involve throwing, such as baseball.  Participate in sports such as tennis, volleyball, and swimming.  Work as a Curator, Games developer, or Architect. Some people are also more likely to develop impingement syndrome because of the shape of their acromion bone. What are the signs or symptoms? The main symptom of this condition is pain on the front or side of the shoulder. The pain may:  Get worse when lifting or raising the arm.  Get worse at night.  Wake you up from sleeping.  Feel  sharp when the shoulder is moved and then fade to an ache. Other symptoms may include:  Tenderness.  Stiffness.  Inability to raise the arm above shoulder level or behind the body.  Weakness. How is this diagnosed? This condition may be diagnosed based on:  Your symptoms and medical history.  A physical exam.  Imaging tests, such as: ? X-rays. ? MRI. ? Ultrasound. How is this treated? This condition may be treated by:  Resting your shoulder and avoiding all activities that cause pain or put stress on the shoulder.  Icing your shoulder.  NSAIDs to help reduce pain and swelling.  One or more injections of medicines to numb the area and reduce inflammation.  Physical therapy.  Surgery. This may be needed if nonsurgical treatments have not helped. Surgery may involve repairing the rotator cuff, reshaping the acromion, or removing the bursa. Follow these instructions at home: Managing pain, stiffness, and swelling  If directed, put ice on the injured area. ? Put ice in a plastic bag. ? Place a towel between your skin and the bag. ? Leave the ice on for 20 minutes, 2-3 times a day.   Activity  Rest and return to your normal activities as told by your health care provider. Ask your health care provider what activities are safe for you.  Do exercises as told by your health care provider. General instructions  Do not use any products that contain nicotine or tobacco, such as cigarettes, e-cigarettes, and chewing tobacco. These can delay healing. If you need help quitting, ask your health care  provider.  Ask your health care provider when it is safe for you to drive.  Take over-the-counter and prescription medicines only as told by your health care provider.  Keep all follow-up visits as told by your health care provider. This is important. How is this prevented?  Give your body time to rest between periods of activity.  Be safe and responsible while being active.  This will help you avoid falls.  Maintain physical fitness, including strength and flexibility. Contact a health care provider if:  Your symptoms have not improved after 1-2 months of treatment and rest.  You cannot lift your arm away from your body. Summary  Shoulder impingement syndrome is a condition that causes pain when connective tissues (tendons) surrounding the shoulder joint become pinched.  The main symptom of this condition is pain on the front or side of the shoulder.  This condition is usually treated with rest, ice, and pain medicines as needed. This information is not intended to replace advice given to you by your health care provider. Make sure you discuss any questions you have with your health care provider. Document Revised: 12/17/2018 Document Reviewed: 02/17/2018 Elsevier Patient Education  2021 [employer].

## 2021-01-23 ENCOUNTER — Encounter: Payer: Self-pay | Admitting: Orthopedic Surgery

## 2021-02-18 DIAGNOSIS — E785 Hyperlipidemia, unspecified: Secondary | ICD-10-CM | POA: Insufficient documentation

## 2021-02-18 DIAGNOSIS — K219 Gastro-esophageal reflux disease without esophagitis: Secondary | ICD-10-CM | POA: Insufficient documentation

## 2021-02-22 ENCOUNTER — Other Ambulatory Visit (HOSPITAL_COMMUNITY): Payer: Self-pay | Admitting: Neurology

## 2021-02-22 ENCOUNTER — Other Ambulatory Visit: Payer: Self-pay | Admitting: Neurology

## 2021-02-22 DIAGNOSIS — Z122 Encounter for screening for malignant neoplasm of respiratory organs: Secondary | ICD-10-CM

## 2021-02-22 DIAGNOSIS — Z1231 Encounter for screening mammogram for malignant neoplasm of breast: Secondary | ICD-10-CM

## 2021-03-01 ENCOUNTER — Other Ambulatory Visit: Payer: Self-pay

## 2021-03-01 ENCOUNTER — Ambulatory Visit (HOSPITAL_COMMUNITY)
Admission: RE | Admit: 2021-03-01 | Discharge: 2021-03-01 | Disposition: A | Discharge status: Home / Self Care | Payer: 59 | Source: Ambulatory Visit | Attending: Neurology | Admitting: Neurology

## 2021-03-01 DIAGNOSIS — Z1231 Encounter for screening mammogram for malignant neoplasm of breast: Secondary | ICD-10-CM | POA: Insufficient documentation

## 2021-03-14 ENCOUNTER — Other Ambulatory Visit: Payer: Self-pay

## 2021-03-14 ENCOUNTER — Encounter: Payer: Self-pay | Admitting: Orthopedic Surgery

## 2021-03-14 ENCOUNTER — Ambulatory Visit (INDEPENDENT_AMBULATORY_CARE_PROVIDER_SITE_OTHER): Payer: 59 | Admitting: Orthopedic Surgery

## 2021-03-14 VITALS — BP 188/131 | HR 127 | Ht 59.0 in | Wt 377.0 lb

## 2021-03-14 DIAGNOSIS — M25511 Pain in right shoulder: Secondary | ICD-10-CM

## 2021-03-14 DIAGNOSIS — M7541 Impingement syndrome of right shoulder: Secondary | ICD-10-CM | POA: Diagnosis not present

## 2021-03-14 DIAGNOSIS — Z6841 Body Mass Index (BMI) 40.0 and over, adult: Secondary | ICD-10-CM

## 2021-03-14 NOTE — Progress Notes (Signed)
FOLLOW UP   Encounter Diagnoses  Name Primary?   Acute pain of right shoulder Yes   Morbid obesity ([place])    Body mass index 60.0-69.9, adult ([facility])    Shoulder impingement, right      Chief Complaint  Patient presents with   Shoulder Pain    Right shoulder painful. Wants injection today       A steroid injection was performed at right shoulder sub acromial  using 1% plain Lidocaine and 6 mg of Celestone. This was well tolerated.   Will return for 3 injections

## 2021-03-14 NOTE — Patient Instructions (Signed)
[facility] Surgery, Bariatric Phone: [phone]

## 2021-03-15 ENCOUNTER — Encounter (HOSPITAL_COMMUNITY): Payer: Self-pay

## 2021-03-15 ENCOUNTER — Ambulatory Visit (HOSPITAL_COMMUNITY)
Admission: RE | Admit: 2021-03-15 | Discharge: 2021-03-15 | Disposition: A | Discharge status: Home / Self Care | Payer: 59 | Source: Ambulatory Visit | Attending: Neurology | Admitting: Neurology

## 2021-03-15 DIAGNOSIS — Z122 Encounter for screening for malignant neoplasm of respiratory organs: Secondary | ICD-10-CM

## 2021-04-02 ENCOUNTER — Other Ambulatory Visit (HOSPITAL_COMMUNITY): Payer: Self-pay | Admitting: Neurology

## 2021-04-02 DIAGNOSIS — Z122 Encounter for screening for malignant neoplasm of respiratory organs: Secondary | ICD-10-CM

## 2021-04-03 ENCOUNTER — Telehealth (HOSPITAL_COMMUNITY): Payer: Self-pay

## 2021-05-02 ENCOUNTER — Ambulatory Visit (HOSPITAL_COMMUNITY)
Admission: RE | Admit: 2021-05-02 | Discharge: 2021-05-02 | Disposition: A | Discharge status: Home / Self Care | Payer: 59 | Source: Ambulatory Visit | Attending: Neurology | Admitting: Neurology

## 2021-05-02 ENCOUNTER — Other Ambulatory Visit: Payer: Self-pay

## 2021-05-02 DIAGNOSIS — Z122 Encounter for screening for malignant neoplasm of respiratory organs: Secondary | ICD-10-CM | POA: Insufficient documentation

## 2021-05-27 ENCOUNTER — Telehealth: Payer: Self-pay | Admitting: Orthopedic Surgery

## 2021-05-27 DIAGNOSIS — M1712 Unilateral primary osteoarthritis, left knee: Secondary | ICD-10-CM

## 2021-05-27 DIAGNOSIS — Z6841 Body Mass Index (BMI) 40.0 and over, adult: Secondary | ICD-10-CM

## 2021-05-27 NOTE — Telephone Encounter (Signed)
Patient called to relay that her rolling walker has broken. She is asking if she may get a wheelchair which will be able to accommodate 500 pounds. States her insurance is [employer] and [employer]. Her phone number is [phone].

## 2021-05-29 NOTE — Telephone Encounter (Signed)
She wants the order to be for a wheelchair walker, I am not sure what that is but DR Aline Brochure said ok for that, so I wrote it in on the order. She can pick it up.

## 2021-05-29 NOTE — Telephone Encounter (Signed)
Have prepared order  Will let her know when it is signed

## 2021-05-29 NOTE — Telephone Encounter (Signed)
Called to advise order ready

## 2021-06-13 ENCOUNTER — Ambulatory Visit (INDEPENDENT_AMBULATORY_CARE_PROVIDER_SITE_OTHER): Payer: 59 | Admitting: Orthopedic Surgery

## 2021-06-13 ENCOUNTER — Encounter: Payer: Self-pay | Admitting: Orthopedic Surgery

## 2021-06-13 ENCOUNTER — Other Ambulatory Visit: Payer: Self-pay

## 2021-06-13 VITALS — Ht 59.0 in | Wt 367.0 lb

## 2021-06-13 DIAGNOSIS — M170 Bilateral primary osteoarthritis of knee: Secondary | ICD-10-CM | POA: Diagnosis not present

## 2021-06-13 DIAGNOSIS — M1711 Unilateral primary osteoarthritis, right knee: Secondary | ICD-10-CM

## 2021-06-13 DIAGNOSIS — M7541 Impingement syndrome of right shoulder: Secondary | ICD-10-CM | POA: Diagnosis not present

## 2021-06-13 DIAGNOSIS — M1712 Unilateral primary osteoarthritis, left knee: Secondary | ICD-10-CM

## 2021-06-13 DIAGNOSIS — Z6841 Body Mass Index (BMI) 40.0 and over, adult: Secondary | ICD-10-CM

## 2021-06-13 DIAGNOSIS — M25511 Pain in right shoulder: Secondary | ICD-10-CM

## 2021-06-13 NOTE — Progress Notes (Signed)
Chief Complaint  Patient presents with   Injections    Would like to do Rt shoulder and both knees.   Encounter Diagnoses  Name Primary?   Body mass index 60.0-69.9, adult ([facility]) Yes   Primary osteoarthritis of left knee    Acute pain of right shoulder    Morbid obesity ([facility])    Shoulder impingement, right    Primary osteoarthritis of right knee     Injection #1 right shoulder  Subacromial space injected 20-gauge needle posterior approach 6 mg Celestone 4 cc 1% lidocaine skin prepped with alcohol and ethyl chloride  Injection #2 right knee  Right knee joint central transtendon approach with 25-gauge needle.  6 mg Celestone 4 cc 1% lidocaine skin prep alcohol and ethyl chloride   Injection #3 left knee  Left knee joint central transtendon approach 25-gauge needle 6 mg Celestone 4 cc 1% lidocaine skin prepped with alcohol and ethyl chloride  The patient was also given a prescription for a heavy-duty walker

## 2021-06-13 NOTE — Patient Instructions (Signed)
For Bariatric clinic referrals, you have to attend seminar online with [facility], go to [email or web address] or call [facility] [phone] to register for seminar. You have to do this prior to appointment scheduling. They will not schedule appointment with our office, you will need to so this.

## 2021-08-15 IMAGING — MG MM DIGITAL SCREENING BILAT W/ TOMO AND CAD
8 of 16 series · 8 of 40 positions shown · non-contrast
Comparison: Previous exam(s).

CLINICAL DATA: Screening.

EXAM:
DIGITAL SCREENING BILATERAL MAMMOGRAM WITH TOMOSYNTHESIS AND CAD
TECHNIQUE: Bilateral screening digital craniocaudal and mediolateral oblique
mammograms were obtained. Bilateral screening digital breast
tomosynthesis was performed. The images were evaluated with
computer-aided detection.

[L MLO synth-2D (1 of 3)]
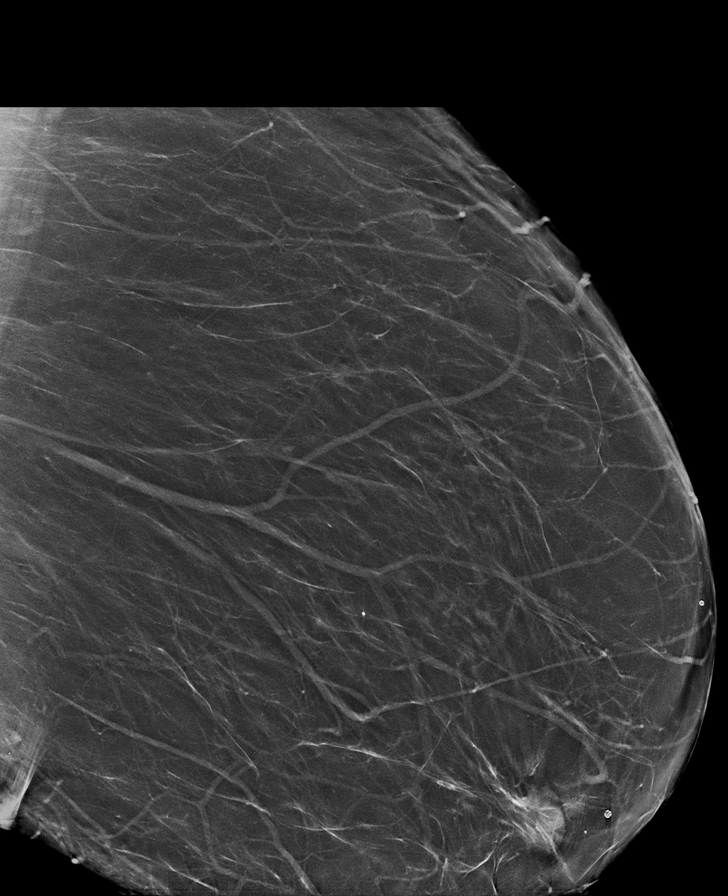

[R CC synth-2D]
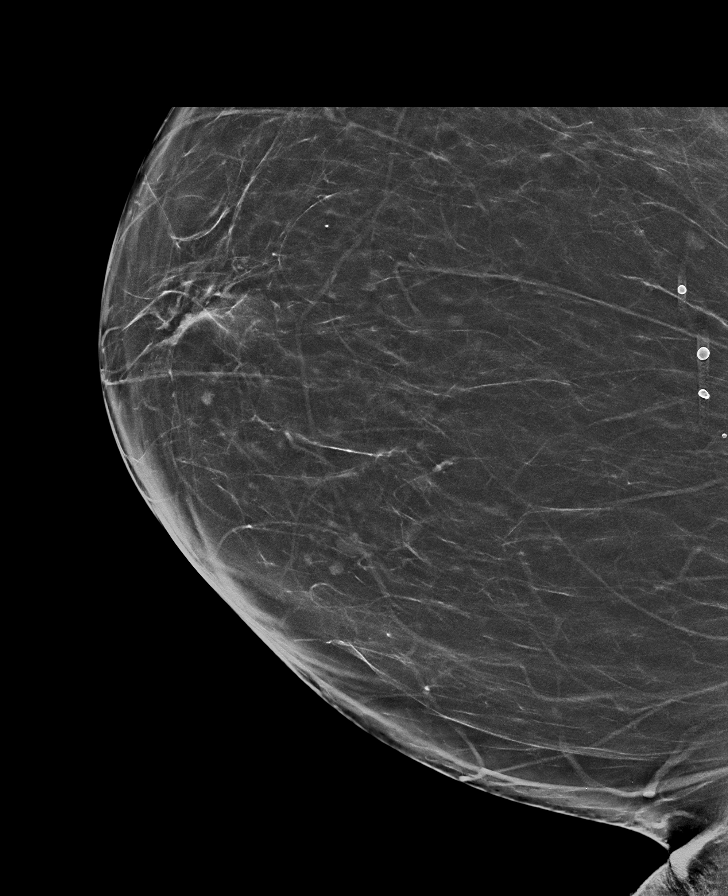

[R MLO synth-2D (1 of 2)]
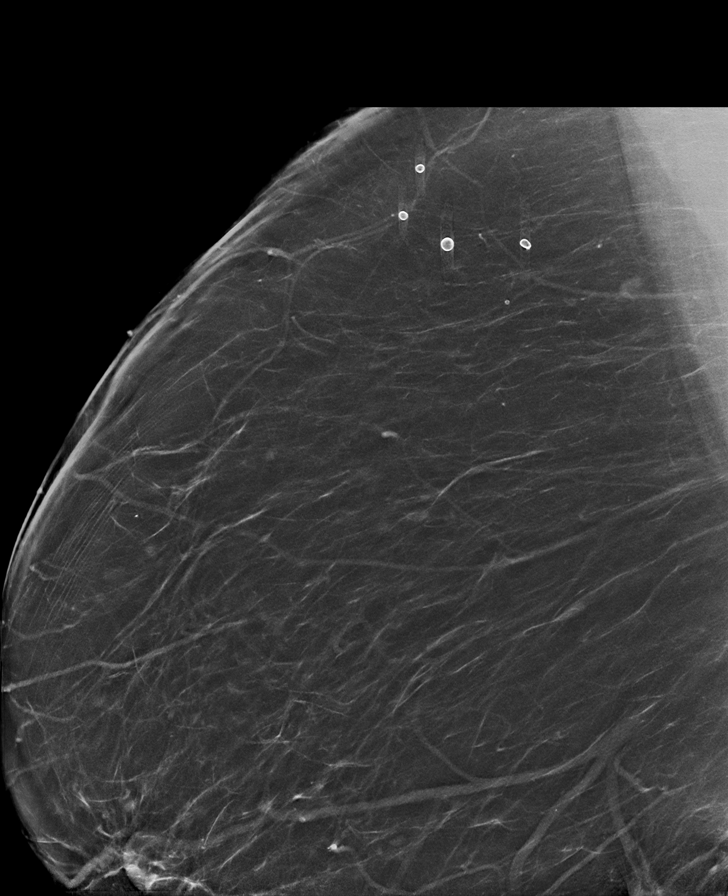

[L MLO synth-2D (2 of 3)]
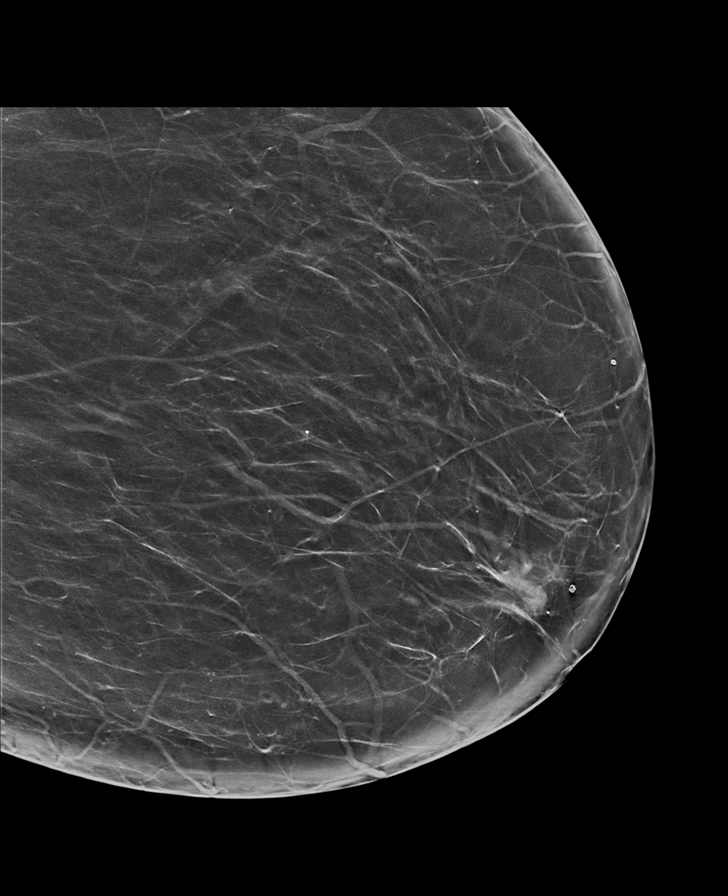

[L CC synth-2D]
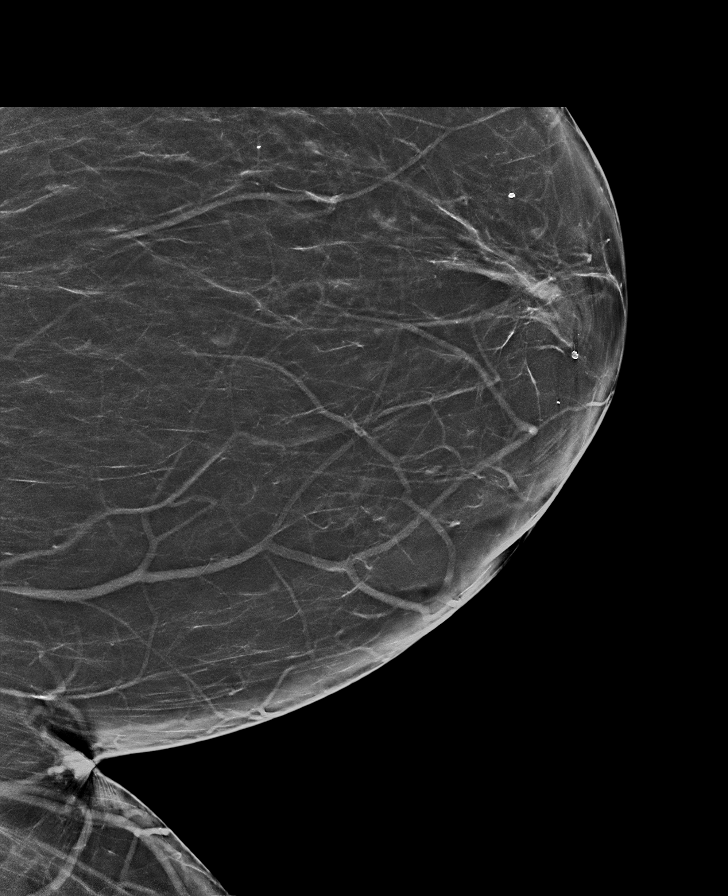

[L MLO synth-2D (3 of 3)]
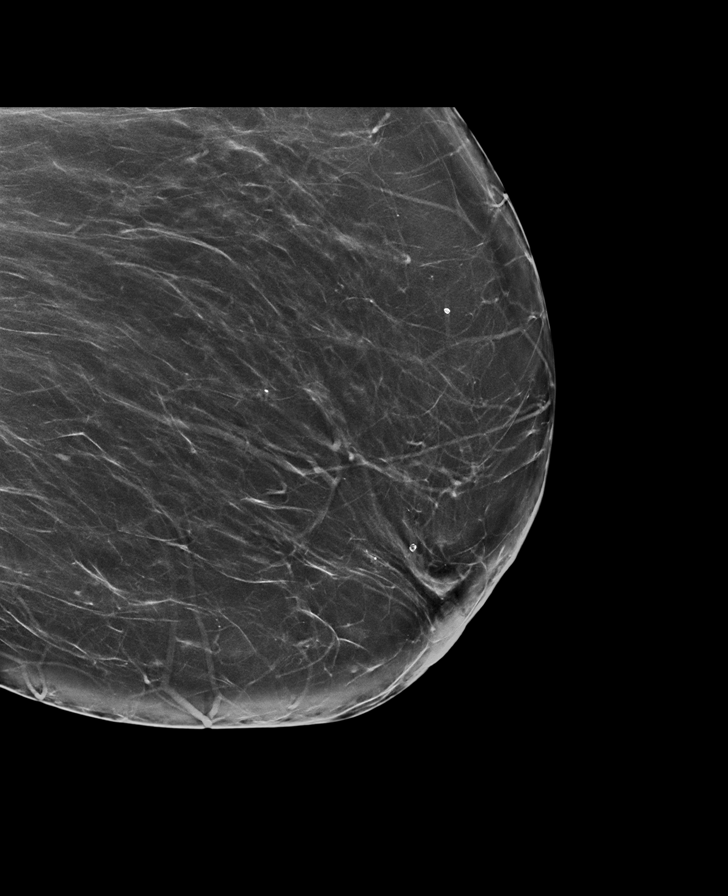

[R MLO synth-2D (2 of 2)]
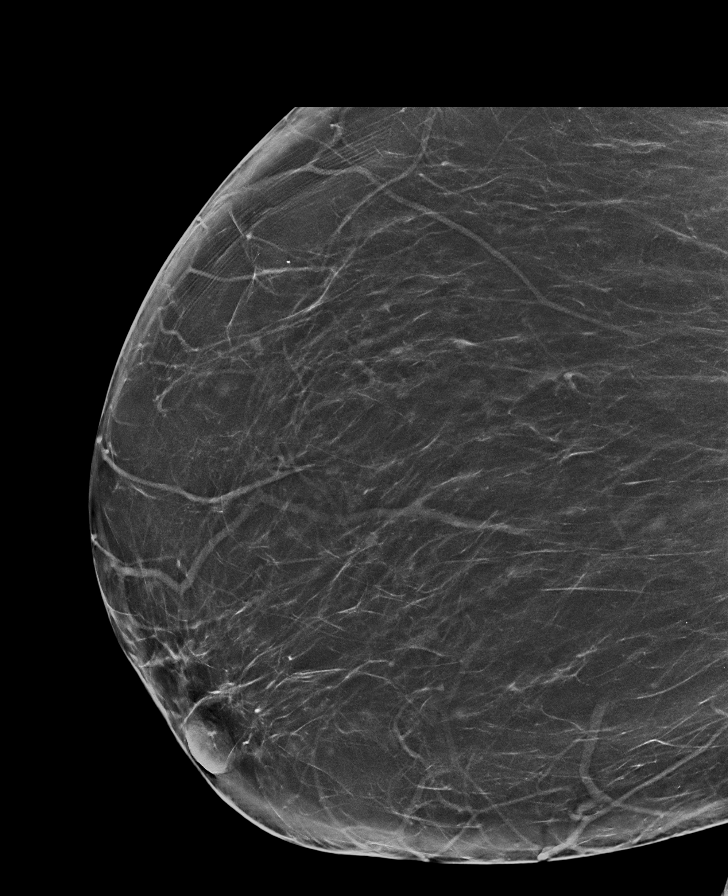

[R MLO tomo · tomo slice 43/86.0]
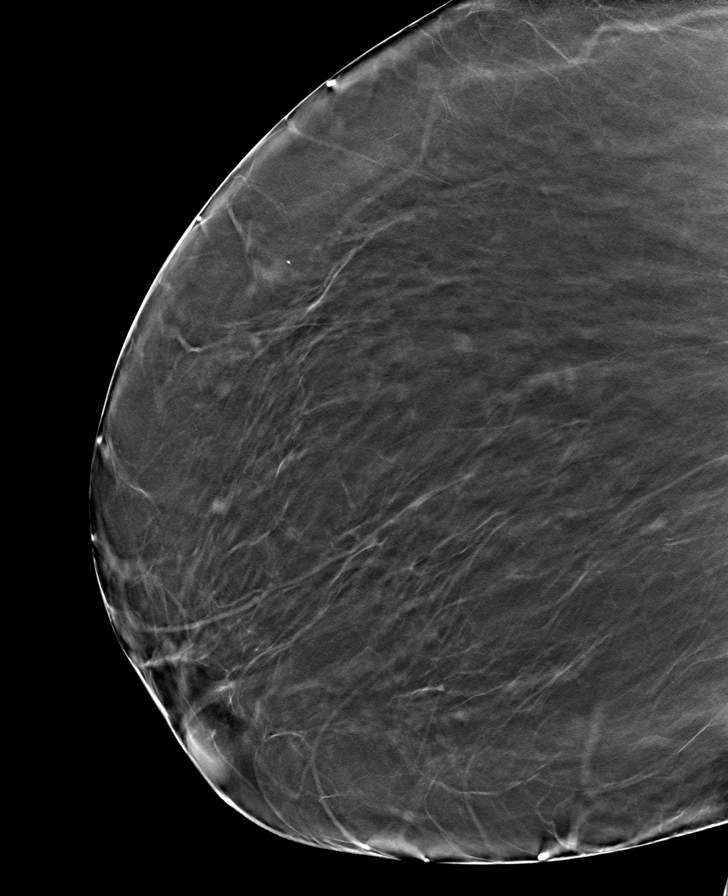

[8 of 40 positions shown; findings below may reference images not displayed]

ACR Breast Density Category b: There are scattered areas of
fibroglandular density.
FINDINGS: There are no findings suspicious for malignancy.
IMPRESSION: No mammographic evidence of malignancy. A result letter of this
screening mammogram will be mailed directly to the patient.

RECOMMENDATION:
Screening mammogram in one year. (Code:51-O-LD2)

BI-RADS CATEGORY  1: Negative.

## 2021-12-02 ENCOUNTER — Ambulatory Visit (INDEPENDENT_AMBULATORY_CARE_PROVIDER_SITE_OTHER): Payer: 59 | Admitting: Orthopedic Surgery

## 2021-12-02 ENCOUNTER — Other Ambulatory Visit: Payer: Self-pay

## 2021-12-02 DIAGNOSIS — M1712 Unilateral primary osteoarthritis, left knee: Secondary | ICD-10-CM

## 2021-12-02 DIAGNOSIS — M170 Bilateral primary osteoarthritis of knee: Secondary | ICD-10-CM

## 2021-12-02 DIAGNOSIS — M1711 Unilateral primary osteoarthritis, right knee: Secondary | ICD-10-CM

## 2021-12-02 DIAGNOSIS — Z6841 Body Mass Index (BMI) 40.0 and over, adult: Secondary | ICD-10-CM

## 2021-12-02 NOTE — Progress Notes (Signed)
FOLLOW UP  ? ?Encounter Diagnoses  ?Name Primary?  ? Body mass index 60.0-69.9, adult ([place]) Yes  ? Primary osteoarthritis of left knee   ? Primary osteoarthritis of right knee   ? Morbid obesity ([place])   ? ? ? ?Chief Complaint  ?Patient presents with  ? Knee Pain  ?  Bilateral knee pain ?Wants injections in both knees  ? ? ? ?Procedure note for bilateral knee injections ? ?Procedure note left knee injection verbal consent was obtained to inject left knee joint ? ?Timeout was completed to confirm the site of injection ? ?The medications used were 40 mg depomedrol and 3 cc of 1% lidocaine  ?Anesthesia was provided by ethyl chloride and the skin was prepped with alcohol. ? ?After cleaning the skin with alcohol a 20-gauge needle was used to inject the left knee joint. There were no complications. A sterile bandage was applied. ? ? ?Procedure note right knee injection verbal consent was obtained to inject right knee joint ? ?Timeout was completed to confirm the site of injection ? ?The medications used were 40 mg depomedrol and 3 cc of 1% lidocaine  ?Anesthesia was provided by ethyl chloride and the skin was prepped with alcohol. ? ?After cleaning the skin with alcohol a 20-gauge needle was used to inject the right knee joint. There were no complications. A sterile bandage was applied.  ?

## 2021-12-12 ENCOUNTER — Ambulatory Visit (INDEPENDENT_AMBULATORY_CARE_PROVIDER_SITE_OTHER): Payer: 59 | Admitting: Orthopedic Surgery

## 2021-12-12 DIAGNOSIS — M25511 Pain in right shoulder: Secondary | ICD-10-CM

## 2021-12-12 DIAGNOSIS — Z6841 Body Mass Index (BMI) 40.0 and over, adult: Secondary | ICD-10-CM

## 2021-12-12 DIAGNOSIS — M25512 Pain in left shoulder: Secondary | ICD-10-CM | POA: Diagnosis not present

## 2021-12-12 DIAGNOSIS — G8929 Other chronic pain: Secondary | ICD-10-CM

## 2021-12-12 NOTE — Progress Notes (Signed)
FOLLOW UP  ? ?Encounter Diagnoses  ?Name Primary?  ? Body mass index 60.0-69.9, adult ([place]) Yes  ? Chronic pain of both shoulders   ? ? ? ?Chief Complaint  ?Patient presents with  ? Shoulder Pain  ?  Bilateral ?R>L ?Wants injections in both shoulders  ? ? ?Procedure note the subacromial injection shoulder left  ? ?Verbal consent was obtained to inject the  Left   Shoulder ? ?Timeout was completed to confirm the injection site is a subacromial space of the  left  shoulder ? ?Medication used Depo-Medrol 40 mg and lidocaine 1% 3 cc ? ?Anesthesia was provided by ethyl chloride ? ?The injection was performed in the left  posterior subacromial space. After pinning the skin with alcohol and anesthetized the skin with ethyl chloride the subacromial space was injected using a 20-gauge needle. There were no complications ? ?Sterile dressing was applied. ? ? ? ? ? ? ? ? ?Procedure note the subacromial injection shoulder RIGHT  ? ? ?Verbal consent was obtained to inject the  RIGHT   Shoulder ? ?Timeout was completed to confirm the injection site is a subacromial space of the  RIGHT  shoulder ? ? ?Medication used Depo-Medrol 40 mg and lidocaine 1% 3 cc ? ?Anesthesia was provided by ethyl chloride ? ?The injection was performed in the RIGHT  posterior subacromial space. After pinning the skin with alcohol and anesthetized the skin with ethyl chloride the subacromial space was injected using a 20-gauge needle. There were no complications ? ?Sterile dressing was applied. ?  ?

## 2021-12-12 NOTE — Patient Instructions (Signed)

## 2022-02-20 ENCOUNTER — Ambulatory Visit (INDEPENDENT_AMBULATORY_CARE_PROVIDER_SITE_OTHER): Payer: 59 | Admitting: Orthopedic Surgery

## 2022-02-20 DIAGNOSIS — Z6841 Body Mass Index (BMI) 40.0 and over, adult: Secondary | ICD-10-CM

## 2022-02-20 DIAGNOSIS — M1711 Unilateral primary osteoarthritis, right knee: Secondary | ICD-10-CM

## 2022-02-20 DIAGNOSIS — M25561 Pain in right knee: Secondary | ICD-10-CM

## 2022-02-20 DIAGNOSIS — M1712 Unilateral primary osteoarthritis, left knee: Secondary | ICD-10-CM

## 2022-02-20 DIAGNOSIS — G8929 Other chronic pain: Secondary | ICD-10-CM | POA: Diagnosis not present

## 2022-02-20 DIAGNOSIS — M25562 Pain in left knee: Secondary | ICD-10-CM | POA: Diagnosis not present

## 2022-02-20 NOTE — Progress Notes (Signed)
Chief Complaint  Patient presents with   Knee Pain    Bilat knee pain getting bad over past 3 wks.   Requested injections   Procedure note for bilateral knee injections  Procedure note left knee injection verbal consent was obtained to inject left knee joint  Timeout was completed to confirm the site of injection  The medications used were 40 mg depomedrol and 3 cc of 1% lidocaine  Anesthesia was provided by ethyl chloride and the skin was prepped with alcohol.  After cleaning the skin with alcohol a 20-gauge needle was used to inject the left knee joint. There were no complications. A sterile bandage was applied.   Procedure note right knee injection verbal consent was obtained to inject right knee joint  Timeout was completed to confirm the site of injection  The medications used were 40 mg depomedrol and 3 cc of 1% lidocaine  Anesthesia was provided by ethyl chloride and the skin was prepped with alcohol.  After cleaning the skin with alcohol a 20-gauge needle was used to inject the right knee joint. There were no complications. A sterile bandage was applied.  Encounter Diagnoses  Name Primary?   Body mass index 60.0-69.9, adult ([facility]) Yes   Primary osteoarthritis of left knee    Primary osteoarthritis of right knee    Morbid obesity ([facility])    Bilateral chronic knee pain

## 2022-03-02 ENCOUNTER — Encounter (HOSPITAL_COMMUNITY): Payer: Self-pay | Admitting: *Deleted

## 2022-03-02 ENCOUNTER — Other Ambulatory Visit: Payer: Self-pay

## 2022-03-02 ENCOUNTER — Emergency Department (HOSPITAL_COMMUNITY)
Admission: EM | Admit: 2022-03-02 | Discharge: 2022-03-02 | Disposition: A | Discharge status: Home / Self Care | Payer: 59 | Attending: Emergency Medicine | Admitting: Emergency Medicine

## 2022-03-02 ENCOUNTER — Emergency Department (HOSPITAL_COMMUNITY): Payer: 59

## 2022-03-02 DIAGNOSIS — Z79899 Other long term (current) drug therapy: Secondary | ICD-10-CM | POA: Diagnosis not present

## 2022-03-02 DIAGNOSIS — W16212A Fall in (into) filled bathtub causing other injury, initial encounter: Secondary | ICD-10-CM | POA: Diagnosis not present

## 2022-03-02 DIAGNOSIS — W19XXXA Unspecified fall, initial encounter: Secondary | ICD-10-CM

## 2022-03-02 DIAGNOSIS — R000 Tachycardia, unspecified: Secondary | ICD-10-CM | POA: Insufficient documentation

## 2022-03-02 DIAGNOSIS — M25551 Pain in right hip: Secondary | ICD-10-CM | POA: Diagnosis present

## 2022-03-02 DIAGNOSIS — I10 Essential (primary) hypertension: Secondary | ICD-10-CM | POA: Insufficient documentation

## 2022-03-02 DIAGNOSIS — Y93E1 Activity, personal bathing and showering: Secondary | ICD-10-CM | POA: Insufficient documentation

## 2022-03-02 MED ORDER — HYDROMORPHONE HCL 1 MG/ML IJ SOLN
1.0000 mg | Freq: Once | INTRAMUSCULAR | Status: AC
  Administered 2022-03-02: 1 mg via INTRAMUSCULAR
  Filled 2022-03-02: qty 1

## 2022-03-02 NOTE — ED Provider Notes (Signed)
[facility] EMERGENCY DEPARTMENT Provider Note   CSN: [id number] Arrival date & time: 03/02/22  1618     History  Chief Complaint  Patient presents with   Lytle Michaels    Gwendolyn Gutierrez is a 56 y.o. female.   Fall    Patient with medical history of tubal ligation, hypertension, gout, arthritis, morbid obesity, chronic bilateral ankle pain, neuropathy presenting today due to fall.  Patient was in the shower when she fell on her right hip.  She did not hit her head, no pain elsewhere.  At baseline she is unable to ambulate without assistance.  She uses a walker secondary to her chronic ankle pain.  She lives independently.  Not on any anticoagulation.  No new pain other than the right hip.  Denies any prodromal symptoms preceding the fall, specifically no chest pain or shortness of breath or dizziness.  Home Medications Prior to Admission medications   Medication Sig Start Date End Date Taking? Authorizing Provider  allopurinol (ZYLOPRIM) 300 MG tablet Take 1 tablet by mouth daily.    [provider]  amLODipine (NORVASC) 10 MG tablet  01/17/19   [provider]  DULoxetine (CYMBALTA) 30 MG capsule 1 capsule    [provider]  gabapentin (NEURONTIN) 600 MG tablet  03/08/19   [provider]  hydrochlorothiazide (HYDRODIURIL) 25 MG tablet Take by mouth.    [provider]  metoprolol succinate (TOPROL-XL) 50 MG 24 hr tablet Take 50 mg by mouth daily. 01/08/22   [provider]  omeprazole (PRILOSEC) 20 MG capsule TAKE 1 CAPSULE BY MOUTH IN THE MORNING 30 MINUTES BEFORE MORNING MEAL.    [provider]  oxyCODONE-acetaminophen (PERCOCET) 7.5-325 MG tablet oxycodone-acetaminophen 7.5 mg-325 mg tablet    [provider]  traMADol (ULTRAM) 50 MG tablet Take by mouth every 6 (six) hours as needed.    [provider]      Allergies    Patient has no known allergies.    Review of Systems   Review of  Systems  Physical Exam Updated Vital Signs BP 127/82   Pulse 94   Temp (!) 97.4 F (36.3 C) (Oral)   Resp 20   SpO2 99%  Physical Exam Vitals and nursing note reviewed. Exam conducted with a chaperone present.  Constitutional:      Appearance: Normal appearance. She is obese.     Comments: BMI 61  HENT:     Head: Normocephalic and atraumatic.  Eyes:     General: No scleral icterus.       Right eye: No discharge.        Left eye: No discharge.     Extraocular Movements: Extraocular movements intact.     Pupils: Pupils are equal, round, and reactive to light.  Cardiovascular:     Rate and Rhythm: Regular rhythm. Tachycardia present.     Pulses: Normal pulses.     Heart sounds: Normal heart sounds. No murmur heard.    No friction rub. No gallop.     Comments: DP 1+ right, DP PT 2+ left foot.  Radial pulse 2+ bilaterally, Pulmonary:     Effort: Pulmonary effort is normal. No respiratory distress.     Breath sounds: Normal breath sounds.  Abdominal:     General: Abdomen is flat. Bowel sounds are normal. There is no distension.     Palpations: Abdomen is soft.     Tenderness: There is no abdominal tenderness.  Musculoskeletal:  General: Tenderness present. No deformity.     Cervical back: No tenderness.     Comments: Tenderness to palpation over the right hip.  Is able to raise the right lower extremity, full ROM to knee and ankle.  Skin:    General: Skin is warm and dry.     Coloration: Skin is not jaundiced.  Neurological:     Mental Status: She is alert. Mental status is at baseline.     Coordination: Coordination normal.     Comments: Cranial nerves III through XII are grossly intact, grips and equal bilaterally     ED Results / Procedures / Treatments   Labs (all labs ordered are listed, but only abnormal results are displayed) Labs Reviewed - No data to display  EKG None  Radiology CT Hip Right Wo Contrast  Result Date: 03/02/2022 CLINICAL DATA:   [place] today.  Right hip pain. EXAM: CT OF THE RIGHT HIP WITHOUT CONTRAST TECHNIQUE: Multidetector CT imaging of the right hip was performed according to the standard protocol. Multiplanar CT image reconstructions were also generated. RADIATION DOSE REDUCTION: This exam was performed according to the departmental dose-optimization program which includes automated exposure control, adjustment of the mA and/or kV according to patient size and/or use of iterative reconstruction technique. COMPARISON:  Radiographs, same date. FINDINGS: The right hip is normally located. Mild to moderate degenerative changes but no fracture or AVN. The acetabulum is intact. No right-sided pelvic fractures are identified. The visualized right hemipelvic structures are grossly normal. No obvious significant muscle tear or intramuscular hematoma. IMPRESSION: 1. Mild to moderate degenerative changes involving the right hip but no fracture or AVN. 2. No obvious significant muscle tear or intramuscular hematoma. Electronically Signed   By: Marijo Sanes M.D.   On: 03/02/2022 18:17   DG Hip Unilat W or Wo Pelvis 2-3 Views Right  Result Date: 03/02/2022 CLINICAL DATA:  Fall, pain EXAM: DG HIP (WITH OR WITHOUT PELVIS) 2-3V RIGHT COMPARISON:  None Available. FINDINGS: There is no evidence of displaced hip fracture or dislocation. There is no evidence of arthropathy or other focal bone abnormality. IMPRESSION: No displaced fracture or dislocation of the right hip. Please note that plain radiographs are significantly insensitive for hip and pelvic fracture. Consider CT to more sensitively evaluate if there is high clinical concern for fracture. Electronically Signed   By: Delanna Ahmadi M.D.   On: 03/02/2022 17:03    Procedures Procedures    Medications Ordered in ED Medications  HYDROmorphone (DILAUDID) injection 1 mg (has no administration in time range)    ED Course/ Medical Decision Making/ A&P                           Medical  Decision Making Amount and/or Complexity of Data Reviewed Radiology: ordered.  Risk Prescription drug management.   Patient presents due right hip pain following fall in the bathtub.  Differential includes not limited to fracture, dislocation, contusion, alternative source of trauma.  On exam she has no focal deficits.  She is neurovascular intact, there is no contusions or open fractures that I can visualize.  Physical exam is somewhat limited secondary to body habitus I do not appreciate any deformity of the right hip.  I ordered and reviewed x-rays of pelvis and hip which were not notable for any acute process.  Also proceed with CT right hip given she is having pain there which does not show any fracture or dislocation.  On reevaluation pain is improved, do not feel she needs any additional work-up at this time.  Discharged in stable condition.        Final Clinical Impression(s) / ED Diagnoses Final diagnoses:  Fall, initial encounter    Rx / DC Orders ED Discharge Orders     None         Sherrill Raring, Hershal Coria 03/02/22 1830    Noemi Chapel, MD 03/05/22 1101

## 2022-03-02 NOTE — ED Triage Notes (Signed)
Pt states she slipped and fell while in bathtub today; pt landed on right hip and is having pain

## 2022-03-17 ENCOUNTER — Telehealth: Payer: Self-pay | Admitting: Orthopedic Surgery

## 2022-03-17 DIAGNOSIS — M1712 Unilateral primary osteoarthritis, left knee: Secondary | ICD-10-CM

## 2022-03-17 DIAGNOSIS — M1711 Unilateral primary osteoarthritis, right knee: Secondary | ICD-10-CM

## 2022-03-17 NOTE — Telephone Encounter (Signed)
Patient called and wants to speak with Dr. Aline Brochure nurse.  She is trying to get a chair walker and she has been on the phone with [facility] this morning.  She would like someone to get right on it this morning.

## 2022-03-17 NOTE — Telephone Encounter (Signed)
Patient called back with additional information as follows:  - due to the wheel falling off of her walker; states has contacted her insurer, [facility], and is requesting that Dr Ruthe Mannan office fax the notes needed, for replacemennt  "Rolling Maxi-walker with chair"; states she is over 350 lbs, and therefore needs this type of walker order and notes sent to  - Attention [employer], [place]:    Ph [phone]    / Fax [phone]

## 2022-03-19 NOTE — Telephone Encounter (Signed)
I have put in the order will send when the order is signed

## 2022-03-20 ENCOUNTER — Ambulatory Visit (INDEPENDENT_AMBULATORY_CARE_PROVIDER_SITE_OTHER): Payer: 59 | Admitting: Orthopedic Surgery

## 2022-03-20 ENCOUNTER — Encounter: Payer: Self-pay | Admitting: Orthopedic Surgery

## 2022-03-20 VITALS — Ht 59.0 in | Wt 367.0 lb

## 2022-03-20 DIAGNOSIS — G8929 Other chronic pain: Secondary | ICD-10-CM

## 2022-03-20 DIAGNOSIS — M25511 Pain in right shoulder: Secondary | ICD-10-CM

## 2022-03-20 DIAGNOSIS — M25512 Pain in left shoulder: Secondary | ICD-10-CM

## 2022-03-20 NOTE — Progress Notes (Signed)
Chief Complaint  Patient presents with   Shoulder Pain    Bilat shoulders but L>R   Procedure injection right shoulder subacromial joint and left shoulder subacromial joint   procedure note the subacromial injection shoulder RIGHT  Verbal consent was obtained to inject the  RIGHT   Shoulder  Timeout was completed to confirm the injection site is a subacromial space of the  RIGHT  shoulder   Medication used Depo-Medrol 40 mg and lidocaine 1% 3 cc  Anesthesia was provided by ethyl chloride  The injection was performed in the RIGHT  posterior subacromial space. After pinning the skin with alcohol and anesthetized the skin with ethyl chloride the subacromial space was injected using a 20-gauge needle. There were no complications  Sterile dressing was applied.    Procedure note the subacromial injection shoulder left   Verbal consent was obtained to inject the  Left   Shoulder  Timeout was completed to confirm the injection site is a subacromial space of the  left  shoulder  Medication used Depo-Medrol 40 mg and lidocaine 1% 3 cc  Anesthesia was provided by ethyl chloride  The injection was performed in the left  posterior subacromial space. After pinning the skin with alcohol and anesthetized the skin with ethyl chloride the subacromial space was injected using a 20-gauge needle. There were no complications  Sterile dressing was applied.

## 2022-03-20 NOTE — Telephone Encounter (Signed)
Dr Aline Brochure gave her the order, he signed it in office today

## 2022-03-20 NOTE — Patient Instructions (Signed)

## 2022-03-26 ENCOUNTER — Telehealth: Payer: Self-pay | Admitting: Orthopedic Surgery

## 2022-03-26 NOTE — Telephone Encounter (Signed)
Amy,   Please call this patient back. She called today stating she has called about her walker and the [facility] equipment place stated they have not received any orders.   Please refer back to previous notes.

## 2022-03-27 NOTE — Telephone Encounter (Signed)
We gave the patient the order in the office, I will fax a copy today.

## 2022-03-27 NOTE — Telephone Encounter (Signed)
Faxed [phone]

## 2022-04-08 ENCOUNTER — Emergency Department (HOSPITAL_COMMUNITY)
Admission: EM | Admit: 2022-04-08 | Discharge: 2022-04-08 | Disposition: A | Discharge status: Home / Self Care | Payer: 59 | Attending: Emergency Medicine | Admitting: Emergency Medicine

## 2022-04-08 ENCOUNTER — Encounter (HOSPITAL_COMMUNITY): Payer: Self-pay | Admitting: *Deleted

## 2022-04-08 ENCOUNTER — Other Ambulatory Visit: Payer: Self-pay

## 2022-04-08 DIAGNOSIS — Y9301 Activity, walking, marching and hiking: Secondary | ICD-10-CM | POA: Insufficient documentation

## 2022-04-08 DIAGNOSIS — Z79899 Other long term (current) drug therapy: Secondary | ICD-10-CM | POA: Diagnosis not present

## 2022-04-08 DIAGNOSIS — W458XXA Other foreign body or object entering through skin, initial encounter: Secondary | ICD-10-CM | POA: Diagnosis not present

## 2022-04-08 DIAGNOSIS — S90851A Superficial foreign body, right foot, initial encounter: Secondary | ICD-10-CM | POA: Diagnosis not present

## 2022-04-08 DIAGNOSIS — I10 Essential (primary) hypertension: Secondary | ICD-10-CM | POA: Diagnosis not present

## 2022-04-08 DIAGNOSIS — Z23 Encounter for immunization: Secondary | ICD-10-CM | POA: Diagnosis not present

## 2022-04-08 MED ORDER — CEPHALEXIN 500 MG PO CAPS: 500.0000 mg | ORAL_CAPSULE | Freq: Three times a day (TID) | ORAL | 0 refills | Status: AC

## 2022-04-08 MED ORDER — LIDOCAINE HCL (PF) 1 % IJ SOLN
INTRAMUSCULAR | Status: AC
  Administered 2022-04-08: 5 mL via INTRAMUSCULAR
  Filled 2022-04-08: qty 10

## 2022-04-08 MED ORDER — TETANUS-DIPHTH-ACELL PERTUSSIS 5-2.5-18.5 LF-MCG/0.5 IM SUSY
0.5000 mL | PREFILLED_SYRINGE | Freq: Once | INTRAMUSCULAR | Status: AC
  Administered 2022-04-08: 0.5 mL via INTRAMUSCULAR
  Filled 2022-04-08: qty 0.5

## 2022-04-08 NOTE — ED Triage Notes (Signed)
Pt states she stepped on stick last night using her right foot; pt has wound to bottom of right foot

## 2022-04-08 NOTE — ED Provider Notes (Signed)
[facility] EMERGENCY DEPARTMENT Provider Note   CSN: [id number] Arrival date & time: 04/08/22  9741     History  Chief Complaint  Patient presents with   Foreign Body    Gwendolyn Gutierrez is a 56 y.o. female.  HPI Patient presents for a foreign body in foot.  Medical history includes arthritis, gout, HTN, neuropathy, obesity, HLD, and GERD.  Patient reports that she was walking on a wooden floor last night at around 9 PM.  She was wearing socks at the time.  She stepped on a piece of wood that was sticking out of the floor which went through her sock into her foot.  She has since had pain and tenderness in this area.  She attempted to remove it on her own with tweezers but was unable to.  For this reason, she presents to the ED.  She denies any other physical complaints.    Home Medications Prior to Admission medications   Medication Sig Start Date End Date Taking? Authorizing Provider  cephALEXin (KEFLEX) 500 MG capsule Take 1 capsule (500 mg total) by mouth 3 (three) times daily for 5 days. 04/08/22 04/13/22 Yes Godfrey Pick, MD  allopurinol (ZYLOPRIM) 300 MG tablet Take 1 tablet by mouth daily.    [provider]  amLODipine (NORVASC) 10 MG tablet  01/17/19   [provider]  DULoxetine (CYMBALTA) 30 MG capsule 1 capsule    [provider]  gabapentin (NEURONTIN) 600 MG tablet  03/08/19   [provider]  hydrochlorothiazide (HYDRODIURIL) 25 MG tablet Take by mouth.    [provider]  metoprolol succinate (TOPROL-XL) 50 MG 24 hr tablet Take 50 mg by mouth daily. 01/08/22   [provider]  omeprazole (PRILOSEC) 20 MG capsule TAKE 1 CAPSULE BY MOUTH IN THE MORNING 30 MINUTES BEFORE MORNING MEAL.    [provider]  oxyCODONE-acetaminophen (PERCOCET) 7.5-325 MG tablet oxycodone-acetaminophen 7.5 mg-325 mg tablet    [provider]  traMADol (ULTRAM) 50 MG tablet Take by mouth every 6 (six) hours as needed.    [provider]      Allergies    Patient has no known allergies.    Review of Systems   Review of Systems  Skin:  Positive for wound.  All other systems reviewed and are negative.   Physical Exam Updated Vital Signs BP (!) 142/93   Pulse 94   Temp 98.2 F (36.8 C) (Oral)   Resp 18   Ht '4\' 11"'$  (1.499 m)   Wt (!) 154.2 kg   SpO2 100%   BMI 68.67 kg/m  Physical Exam Vitals and nursing note reviewed.  Constitutional:      General: She is not in acute distress.    Appearance: Normal appearance. She is well-developed. She is not ill-appearing, toxic-appearing or diaphoretic.  HENT:     Head: Normocephalic and atraumatic.     Right Ear: External ear normal.     Left Ear: External ear normal.     Nose: Nose normal.     Mouth/Throat:     Mouth: Mucous membranes are moist.     Pharynx: Oropharynx is clear.  Eyes:     Extraocular Movements: Extraocular movements intact.     Conjunctiva/sclera: Conjunctivae normal.  Cardiovascular:     Rate and Rhythm: Normal rate and regular rhythm.  Pulmonary:     Effort: Pulmonary effort is normal. No respiratory distress.  Abdominal:     General: There is no distension.  Palpations: Abdomen is soft.  Musculoskeletal:        General: Tenderness and signs of injury present. No deformity.     Cervical back: Normal range of motion.     Right lower leg: No edema.     Left lower leg: No edema.  Skin:    General: Skin is warm and dry.     Findings: No erythema.  Neurological:     General: No focal deficit present.     Mental Status: She is alert and oriented to person, place, and time.     Cranial Nerves: No cranial nerve deficit.     Sensory: No sensory deficit.     Motor: No weakness.     Coordination: Coordination normal.  Psychiatric:        Mood and Affect: Mood normal.        Behavior: Behavior normal.        Thought Content: Thought content normal.        Judgment: Judgment normal.     ED Results / Procedures /  Treatments   Labs (all labs ordered are listed, but only abnormal results are displayed) Labs Reviewed - No data to display  EKG None  Radiology No results found.  Procedures .Foreign Body Removal  Date/Time: 04/08/2022 9:55 AM  Performed by: Godfrey Pick, MD Authorized by: Godfrey Pick, MD  Consent: Verbal consent obtained. Risks and benefits: risks, benefits and alternatives were discussed Consent given by: patient Patient understanding: patient states understanding of the procedure being performed Patient identity confirmed: verbally with patient Body area: skin General location: lower extremity Location details: right foot Anesthesia: local infiltration  Anesthesia: Local Anesthetic: lidocaine 1% without epinephrine Anesthetic total: 5 mL  Sedation: Patient sedated: no  Patient restrained: no Patient cooperative: yes Removal mechanism: forceps Dressing: dressing applied Depth: subcutaneous Complexity: simple 1 objects recovered. Objects recovered: Large wooden splinter Post-procedure assessment: foreign body removed Patient tolerance: patient tolerated the procedure well with no immediate complications       Medications Ordered in ED Medications  Tdap (BOOSTRIX) injection 0.5 mL (has no administration in time range)  lidocaine (PF) (XYLOCAINE) 1 % injection (5 mLs Intramuscular Given 04/08/22 5277)    ED Course/ Medical Decision Making/ A&P                           Medical Decision Making Risk Prescription drug management.   This patient presents to the ED for concern of foreign body, this involves an extensive number of treatment options, and is a complaint that carries with it a high risk of complications and morbidity.  The differential diagnosis includes foreign body, abscess, wound   Co morbidities that complicate the patient evaluation  arthritis, gout, HTN, neuropathy, obesity, HLD, and GERD   Additional history obtained:  Additional  history obtained from N/A External records from outside source obtained and reviewed including EMR  Problem List / ED Course / Critical interventions / Medication management  Patient is a 56 year old female presenting for concern of a a foreign body in the plantar surface of her right foot.  She describes walking on a wooden surface when a piece of wood pierced her sock and entered the bottom of her foot.  She has her sock with her with the wooden piece still in it.  On inspection of her right foot, she does have a firm area of swelling.  Tenderness is present.  I discussed exploration with or without  lidocaine.  Patient elects for local anesthesia.  Intradermal lidocaine was provided.  Wound was explored and a large wooden splinter was removed.  Patient tolerated this well.  Tetanus was updated.  Keflex was prescribed for infection prophylaxis.  She was discharged in good condition. I ordered medication including lidocaine for local anesthesia; Tdap for tetanus prophylaxis Reevaluation of the patient after these medicines showed that the patient resolved I have reviewed the patients home medicines and have made adjustments as needed   Social Determinants of Health:  Has access to outpatient care        Final Clinical Impression(s) / ED Diagnoses Final diagnoses:  Foreign body in right foot, initial encounter    Rx / DC Orders ED Discharge Orders          Ordered    cephALEXin (KEFLEX) 500 MG capsule  3 times daily        04/08/22 0953              Godfrey Pick, MD 04/08/22 [phone]

## 2022-04-08 NOTE — Discharge Instructions (Signed)
A prescription for antibiotic was sent to your pharmacy.  This is to avoid an infection.  Take this as prescribed.  Take your regular home pain medications for management of any residual soreness.  If you have worsened pain, this could be a sign of infection.  Please return if you experience worsened pain, swelling, or drainage of pus.

## 2022-04-08 NOTE — ED Notes (Signed)
ED Provider at bedside. 

## 2022-04-23 ENCOUNTER — Ambulatory Visit: Payer: 59 | Admitting: Orthopedic Surgery

## 2022-04-23 ENCOUNTER — Encounter: Payer: Self-pay | Admitting: Orthopedic Surgery

## 2022-04-24 ENCOUNTER — Ambulatory Visit (INDEPENDENT_AMBULATORY_CARE_PROVIDER_SITE_OTHER): Payer: 59 | Admitting: Orthopedic Surgery

## 2022-04-24 ENCOUNTER — Encounter: Payer: Self-pay | Admitting: Orthopedic Surgery

## 2022-04-24 DIAGNOSIS — M1711 Unilateral primary osteoarthritis, right knee: Secondary | ICD-10-CM

## 2022-04-24 DIAGNOSIS — Z6841 Body Mass Index (BMI) 40.0 and over, adult: Secondary | ICD-10-CM

## 2022-04-24 DIAGNOSIS — M170 Bilateral primary osteoarthritis of knee: Secondary | ICD-10-CM | POA: Diagnosis not present

## 2022-04-24 DIAGNOSIS — M1712 Unilateral primary osteoarthritis, left knee: Secondary | ICD-10-CM

## 2022-04-24 MED ORDER — METHYLPREDNISOLONE ACETATE 40 MG/ML IJ SUSP
40.0000 mg | Freq: Once | INTRAMUSCULAR | Status: AC
  Administered 2022-04-24: 40 mg via INTRA_ARTICULAR

## 2022-04-24 NOTE — Patient Instructions (Signed)

## 2022-04-24 NOTE — Progress Notes (Signed)
FOLLOW UP   Encounter Diagnoses  Name Primary?   Primary osteoarthritis of left knee Yes   Primary osteoarthritis of right knee    Morbid obesity ([place])    Body mass index 60.0-69.9, adult [facility])      Chief Complaint  Patient presents with   Knee Pain    Bilateral wants injections      Procedure note for bilateral knee injections  Procedure note left knee injection verbal consent was obtained to inject left knee joint  Timeout was completed to confirm the site of injection  The medications used were 40 mg depomedrol and 3 cc of 1% lidocaine  Anesthesia was provided by ethyl chloride and the skin was prepped with alcohol.  After cleaning the skin with alcohol a 20-gauge needle was used to inject the left knee joint. There were no complications. A sterile bandage was applied.   Procedure note right knee injection verbal consent was obtained to inject right knee joint  Timeout was completed to confirm the site of injection  The medications used were 40 mg depomedrol and 3 cc of 1% lidocaine  Anesthesia was provided by ethyl chloride and the skin was prepped with alcohol.  After cleaning the skin with alcohol a 20-gauge needle was used to inject the right knee joint. There were no complications. A sterile bandage was applied.

## 2022-04-30 ENCOUNTER — Ambulatory Visit (HOSPITAL_COMMUNITY): Payer: 59 | Attending: Internal Medicine | Admitting: Physical Therapy

## 2022-04-30 DIAGNOSIS — R2689 Other abnormalities of gait and mobility: Secondary | ICD-10-CM | POA: Insufficient documentation

## 2022-04-30 NOTE — Therapy (Signed)
OUTPATIENT PHYSICAL THERAPY LOWER EXTREMITY EVALUATION   Patient Name: Gwendolyn Gutierrez MRN: [id number] DOB:[birth year], 56 y.o., female Today's Date: 04/30/2022   PT End of Session - 04/30/22 1151     Visit Number 1    Number of Visits 12    Date for PT Re-Evaluation 06/11/22    Authorization Type [employer]/ [employer]    Progress Note Due on Visit 10    PT Start Time 1115    PT Stop Time 1155    PT Time Calculation (min) 40 min    Activity Tolerance Patient tolerated treatment well;Patient limited by fatigue    Behavior During Therapy [facility] for tasks assessed/performed             Past Medical History:  Diagnosis Date   Arthritis    Gout    Hypertension    Neuropathy    Past Surgical History:  Procedure Laterality Date   ANKLE SURGERY     TUBAL LIGATION     Patient Active Problem List   Diagnosis Date Noted   Hyperlipidemia 02/18/2021   Gastroesophageal reflux disease 02/18/2021   Difficulty in walking, not elsewhere classified 01/14/2021   Idiopathic peripheral neuropathy 01/14/2021   Body mass index (BMI) 60.0-69.9, adult ([place]) 01/14/2021   Morbid obesity ([place]) 01/14/2021   Neuropathy 01/14/2021   Long-term current use of opiate analgesic 12/13/2019   Hypertension 12/13/2019   Tibial plateau fracture, left 01/29/19 02/24/2019   Benign neoplasm of colon 05/27/2018   Gout 12/20/2016   Osteoarthritis of both knees 06/19/2016   Elevated C-reactive protein 01/07/2016   Primary osteoarthritis of both ankles 01/07/2016   Vitamin D deficiency 01/07/2016   Chronic pain of both ankles 12/17/2015   Numbness and tingling of both legs below knees 12/17/2015   Chronic pain of right ankle 05/25/2014   High risk medication use 05/25/2014   Knee pain, left anterior 05/25/2014   Morbid obesity with BMI of 60.0-69.9, adult ([place]) 05/25/2014   Osteoarthritis of left knee 04/05/2014    PCP: Abran Richard, MD   REFERRING PROVIDER: Abran Richard, MD    REFERRING DIAG: PT eval/tx for R26.2 difficulty in walking per Abran Richard, MD   THERAPY DIAG:  Other abnormalities of gait and mobility - Plan: PT plan of care cert/re-cert  Rationale for Evaluation and Treatment Rehabilitation  ONSET DATE: Chronic   SUBJECTIVE:   SUBJECTIVE STATEMENT: Patient reports several year history of gait issues. She states she had a fracture on her LT side about 3 years ago and has not walked without assist since then. She reports LT knee issues, previous fractures and balance issues. She is fearful of falling. She is considering getting knee brace for LLE.   PERTINENT HISTORY: Falls, arthritis, neuropathy bilateral   PAIN:  Are you having pain? Yes: NPRS scale: 10/10 Pain location: Lt knee Pain description: aching, dull, constant  Aggravating factors: WB Relieving factors: Meds, non WB  PRECAUTIONS: Fall  WEIGHT BEARING RESTRICTIONS No  FALLS:  Has patient fallen in last 6 months? Yes. Number of falls 2  LIVING ENVIRONMENT: Lives with: lives alone and has aide 7 days a week Lives in: House/apartment Stairs: No Has following equipment at home: Gilford Rile - 2 wheeled and rollator   OCCUPATION: disability   PLOF: Needs assistance with ADLs  PATIENT GOALS  Be able to stand without assist, and be able to take small steps by myself    OBJECTIVE:   DIAGNOSTIC FINDINGS: NA  PATIENT SURVEYS:  LEFS 14/80  COGNITION:  Overall cognitive status: Within functional limits for tasks assessed       LOWER EXTREMITY ROM:  Restricted LT knee extension, and bilateral knee flexion  LOWER EXTREMITY MMT:  MMT Right eval Left eval  Hip flexion 4 4-  Hip extension    Hip abduction    Hip adduction    Hip internal rotation    Hip external rotation    Knee flexion    Knee extension 4 3+  Ankle dorsiflexion 4 4  Ankle plantarflexion    Ankle inversion    Ankle eversion     (Blank rows = not tested)   FUNCTIONAL TESTS:  5 times sit to  stand: 21.84 sec with no UE 2 minute walk test: 230 feet with rollator  GAIT: Distance walked: 230 feet  Assistive device utilized:  rollator Level of assistance: Modified independence Comments: flexed trunk, decreased stride, fatigued   TODAY'S TREATMENT: Eval HEP 5 x STS 2 MWT LEFS   PATIENT EDUCATION:  Education details: on eval findings, POC and HEP  Person educated: Patient Education method: Explanation Education comprehension: verbalized understanding   HOME EXERCISE PROGRAM: Access Code: [email or web address] URL: [email or web address] Date: 04/30/2022 Prepared by: Josue Hector  Exercises - Seated Heel Toe Raises  - 2 x daily - 7 x weekly - 2 sets - 10 reps - Seated Long Arc Quad  - 2 x daily - 7 x weekly - 2 sets - 10 reps - Sit to Stand with Counter Support  - 2 x daily - 7 x weekly - 3 sets - 5 reps - Heel Raises with Counter Support  - 2 x daily - 7 x weekly - 1 sets - 10 reps  ASSESSMENT:  CLINICAL IMPRESSION: Patient is a 56 y.o. female who presents to physical therapy with complaint of weakness, falls and gait disturbance. Patient demonstrates muscle weakness, reduced ROM, reduced balance and gait deviations which are likely contributing to symptoms of pain and are negatively impacting patient ability to perform ADLs and functional mobility tasks. Patient will benefit from skilled physical therapy services to address these deficits to reduce pain and improve level of function with ADLs and functional mobility tasks.    OBJECTIVE IMPAIRMENTS Abnormal gait, decreased activity tolerance, decreased balance, decreased endurance, decreased mobility, difficulty walking, decreased ROM, decreased strength, hypomobility, impaired perceived functional ability, improper body mechanics, and pain.   ACTIVITY LIMITATIONS bending, standing, squatting, stairs, transfers, and locomotion level  PARTICIPATION LIMITATIONS: meal prep, cleaning, laundry, shopping, community  activity, and yard work  PERSONAL FACTORS Past/current experiences and Time since onset of injury/illness/exacerbation are also affecting patient's functional outcome.   REHAB POTENTIAL: Good  CLINICAL DECISION MAKING: Stable/uncomplicated  EVALUATION COMPLEXITY: Low   GOALS: SHORT TERM GOALS: Target date: 05/21/2022  Patient will be independent with initial HEP and self-management strategies to improve functional outcomes Baseline:  Goal status: INITIAL   LONG TERM GOALS: Target date: 06/11/2022  Patient will be independent with advanced HEP and self-management strategies to improve functional outcomes Baseline:  Goal status: INITIAL  2.  Patient will improve LEFS score by at least 18 points to indicate improvement in functional outcomes Baseline: 14/80 Goal status: INITIAL  3.  Patient will report reduction of LLE pain to <6/10 for improved quality of life and ability to perform ADLs  Baseline: 10/10 Goal status: INITIAL  4. Patient will have equal to or > 4+/5 MMT throughout BLE to improve ability to perform functional mobility, stair ambulation and ADLs.  Baseline: See MMT  Goal status: INITIAL  5. Patient will be able to perform stand x 5 in < 15 seconds to demonstrate improvement in functional mobility and reduced risk for falls. Baseline: 21.84 sec with no UE Goal status: INITIAL   6. Patient will be able to ambulate at least 325 feet during 2MWT with LRAD to demonstrate improved ability to perform functional mobility and associated tasks. Baseline: 230 feet with rollator Goal status: INITIAL    PLAN: PT FREQUENCY: 2x/week  PT DURATION: 6 weeks  PLANNED INTERVENTIONS: Therapeutic exercises, Therapeutic activity, Neuromuscular re-education, Balance training, Gait training, Patient/Family education, Joint manipulation, Joint mobilization, Stair training, Aquatic Therapy, Dry Needling, Electrical stimulation, Spinal manipulation, Spinal mobilization, Cryotherapy,  Moist heat, scar mobilization, Taping, Traction, Ultrasound, Biofeedback, Ionotophoresis '4mg'$ /ml Dexamethasone, and Manual therapy.   PLAN FOR NEXT SESSION: Progress glute/ quad/ core strength as tolerated. Start seated, progress to standing. Add balance and gait when ready.   11:53 AM, 04/30/22 Josue Hector PT DPT  Physical Therapist with [facility]  [phone]

## 2022-05-06 ENCOUNTER — Telehealth (HOSPITAL_COMMUNITY): Payer: Self-pay

## 2022-05-06 ENCOUNTER — Ambulatory Visit (HOSPITAL_COMMUNITY): Payer: 59

## 2022-05-06 NOTE — Telephone Encounter (Signed)
No show, attempted to call with no answer and voicemail has not been set up.  Ihor Austin, LPTA/CLT; Delana Meyer [phone]

## 2022-05-08 ENCOUNTER — Telehealth (HOSPITAL_COMMUNITY): Payer: Self-pay

## 2022-05-08 ENCOUNTER — Ambulatory Visit (HOSPITAL_COMMUNITY): Payer: 59

## 2022-05-08 ENCOUNTER — Ambulatory Visit (INDEPENDENT_AMBULATORY_CARE_PROVIDER_SITE_OTHER): Payer: 59 | Admitting: Orthopedic Surgery

## 2022-05-08 DIAGNOSIS — M25511 Pain in right shoulder: Secondary | ICD-10-CM | POA: Diagnosis not present

## 2022-05-08 DIAGNOSIS — M25512 Pain in left shoulder: Secondary | ICD-10-CM

## 2022-05-08 DIAGNOSIS — G8929 Other chronic pain: Secondary | ICD-10-CM | POA: Diagnosis not present

## 2022-05-08 NOTE — Telephone Encounter (Signed)
Called patient regarding no show of appointment today; no answer and unable to leave a message; voicemail not set up. 2nd no show  3:46 PM, 05/08/22 Gwendolyn Gutierrez [place] MPT [facility] [place] [phone]

## 2022-05-08 NOTE — Progress Notes (Signed)
Chief Complaint  Patient presents with   Injections    Bilat shoulders    Procedure injection right shoulder subacromial joint and left shoulder subacromial joint   procedure note the subacromial injection shoulder RIGHT  Verbal consent was obtained to inject the  RIGHT   Shoulder  Timeout was completed to confirm the injection site is a subacromial space of the  RIGHT  shoulder   Medication used Depo-Medrol 40 mg and lidocaine 1% 3 cc  Anesthesia was provided by ethyl chloride  The injection was performed in the RIGHT  posterior subacromial space. After pinning the skin with alcohol and anesthetized the skin with ethyl chloride the subacromial space was injected using a 20-gauge needle. There were no complications  Sterile dressing was applied.    Procedure note the subacromial injection shoulder left   Verbal consent was obtained to inject the  Left   Shoulder  Timeout was completed to confirm the injection site is a subacromial space of the  left  shoulder  Medication used Depo-Medrol 40 mg and lidocaine 1% 3 cc  Anesthesia was provided by ethyl chloride  The injection was performed in the left  posterior subacromial space. After pinning the skin with alcohol and anesthetized the skin with ethyl chloride the subacromial space was injected using a 20-gauge needle. There were no complications  Sterile dressing was applied.

## 2022-05-09 DIAGNOSIS — M25511 Pain in right shoulder: Secondary | ICD-10-CM | POA: Diagnosis not present

## 2022-05-09 DIAGNOSIS — G8929 Other chronic pain: Secondary | ICD-10-CM | POA: Diagnosis not present

## 2022-05-09 DIAGNOSIS — M25512 Pain in left shoulder: Secondary | ICD-10-CM | POA: Diagnosis not present

## 2022-05-09 MED ORDER — METHYLPREDNISOLONE ACETATE 40 MG/ML IJ SUSP
40.0000 mg | Freq: Once | INTRAMUSCULAR | Status: AC
  Administered 2022-05-09: 40 mg via INTRA_ARTICULAR

## 2022-05-09 NOTE — Addendum Note (Signed)
Addended by: Elizabeth Sauer on: 05/09/2022 08:27 AM   Modules accepted: Orders

## 2022-05-13 ENCOUNTER — Encounter (HOSPITAL_COMMUNITY): Payer: 59 | Admitting: Physical Therapy

## 2022-05-15 ENCOUNTER — Encounter (HOSPITAL_COMMUNITY): Payer: 59 | Admitting: Physical Therapy

## 2022-05-20 ENCOUNTER — Encounter (HOSPITAL_COMMUNITY): Payer: 59 | Admitting: Physical Therapy

## 2022-05-22 ENCOUNTER — Encounter (HOSPITAL_COMMUNITY): Payer: 59 | Admitting: Physical Therapy

## 2022-05-27 ENCOUNTER — Encounter (HOSPITAL_COMMUNITY): Payer: 59

## 2022-05-29 ENCOUNTER — Encounter (HOSPITAL_COMMUNITY): Payer: 59 | Admitting: Physical Therapy

## 2022-06-03 ENCOUNTER — Encounter (HOSPITAL_COMMUNITY): Payer: 59 | Admitting: Physical Therapy

## 2022-06-05 ENCOUNTER — Encounter (HOSPITAL_COMMUNITY): Payer: 59 | Admitting: Physical Therapy

## 2022-06-10 ENCOUNTER — Encounter (HOSPITAL_COMMUNITY): Payer: 59 | Admitting: Physical Therapy

## 2022-06-12 ENCOUNTER — Encounter (HOSPITAL_COMMUNITY): Payer: 59 | Admitting: Physical Therapy

## 2022-06-16 ENCOUNTER — Telehealth: Payer: Self-pay | Admitting: Radiology

## 2022-06-16 NOTE — Telephone Encounter (Signed)
No injections until after 10/31

## 2022-06-16 NOTE — Telephone Encounter (Signed)
Tried to call patient back, could not reach her.  Will try to call again tomorrow.  [phone].

## 2022-06-16 NOTE — Telephone Encounter (Signed)
Patient called, wants to come in for bil knee and shld injections.  How soon can we schedule?  Patient last had bil shld injections 05/08/22 and bil knee injectoins 04/24/22.  You have injected her sooner than 3 months in between so I wanted to ask, and get her in sooner if we could since she is hurting.  Please advise.

## 2022-06-16 NOTE — Telephone Encounter (Signed)
Patient called, Gwendolyn Gutierrez spoke with her.  Patient is asking for cortisone injections.  Gwendolyn Gutierrez explained to her that Dr Aline Brochure does those every 3 months usually.  Gwendolyn Gutierrez asked for my assistance in speaking with patient.  Patient hung up before I could get to her.  I called her back and "call cannot be completed at this time".  If patient calls back please let me know.

## 2022-07-17 ENCOUNTER — Ambulatory Visit (INDEPENDENT_AMBULATORY_CARE_PROVIDER_SITE_OTHER): Payer: Medicare Other | Admitting: Orthopedic Surgery

## 2022-07-17 ENCOUNTER — Encounter: Payer: Self-pay | Admitting: Orthopedic Surgery

## 2022-07-17 DIAGNOSIS — M1712 Unilateral primary osteoarthritis, left knee: Secondary | ICD-10-CM

## 2022-07-17 DIAGNOSIS — G8929 Other chronic pain: Secondary | ICD-10-CM | POA: Diagnosis not present

## 2022-07-17 DIAGNOSIS — M25512 Pain in left shoulder: Secondary | ICD-10-CM | POA: Diagnosis not present

## 2022-07-17 DIAGNOSIS — M25511 Pain in right shoulder: Secondary | ICD-10-CM

## 2022-07-17 MED ORDER — METHYLPREDNISOLONE ACETATE 40 MG/ML IJ SUSP
40.0000 mg | Freq: Once | INTRAMUSCULAR | Status: AC
  Administered 2022-07-17: 40 mg via INTRA_ARTICULAR

## 2022-07-17 NOTE — Addendum Note (Signed)
Addended byCandice Camp on: 07/17/2022 10:49 AM   Modules accepted: Orders

## 2022-07-17 NOTE — Progress Notes (Signed)
Follow-up visit  Chief Complaint  Patient presents with   Injections    Left shoulder and left knee wants injections     Encounter Diagnoses  Name Primary?   Chronic pain of both shoulders Yes   Primary osteoarthritis of left knee     Procedure note left knee injection   verbal consent was obtained to inject left knee joint  Timeout was completed to confirm the site of injection  The medications used were depomedrol 40 mg and 1% lidocaine 3 cc Anesthesia was provided by ethyl chloride and the skin was prepped with alcohol.  After cleaning the skin with alcohol a 20-gauge needle was used to inject the left knee joint. There were no complications. A sterile bandage was applied.  Procedure note the subacromial injection shoulder left   Verbal consent was obtained to inject the  Left   Shoulder  Timeout was completed to confirm the injection site is a subacromial space of the  left  shoulder  Medication used Depo-Medrol 40 mg and lidocaine 1% 3 cc  Anesthesia was provided by ethyl chloride  The injection was performed in the left  posterior subacromial space. After pinning the skin with alcohol and anesthetized the skin with ethyl chloride the subacromial space was injected using a 20-gauge needle. There were no complications  Sterile dressing was applied.

## 2022-07-24 ENCOUNTER — Ambulatory Visit (INDEPENDENT_AMBULATORY_CARE_PROVIDER_SITE_OTHER): Payer: Medicare Other | Admitting: Orthopedic Surgery

## 2022-07-24 DIAGNOSIS — M75101 Unspecified rotator cuff tear or rupture of right shoulder, not specified as traumatic: Secondary | ICD-10-CM | POA: Diagnosis not present

## 2022-07-24 DIAGNOSIS — M1711 Unilateral primary osteoarthritis, right knee: Secondary | ICD-10-CM

## 2022-07-24 NOTE — Progress Notes (Signed)
FOLLOW UP   Encounter Diagnoses  Name Primary?   Primary osteoarthritis of right knee Yes   Rotator cuff syndrome of right shoulder      Chief Complaint  Patient presents with   Injections    Rt shoulder and R knee wants injections    Patient presents for injections  Procedure note right knee injection   verbal consent was obtained to inject right knee joint  Timeout was completed to confirm the site of injection  The medications used were depomedrol 40 mg and 1% lidocaine 3 cc Anesthesia was provided by ethyl chloride and the skin was prepped with alcohol.  After cleaning the skin with alcohol a 20-gauge needle was used to inject the right knee joint. There were no complications. A sterile bandage was applied.     Procedure note the subacromial injection shoulder RIGHT    Verbal consent was obtained to inject the  RIGHT   Shoulder  Timeout was completed to confirm the injection site is a subacromial space of the  RIGHT  shoulder   Medication used Depo-Medrol 40 mg and lidocaine 1% 3 cc  Anesthesia was provided by ethyl chloride  The injection was performed in the RIGHT  posterior subacromial space. After pinning the skin with alcohol and anesthetized the skin with ethyl chloride the subacromial space was injected using a 20-gauge needle. There were no complications  Sterile dressing was applied.    Follow-up 10 weeks or 2-1/2 months

## 2022-10-02 ENCOUNTER — Ambulatory Visit: Payer: 59 | Admitting: Orthopedic Surgery

## 2022-10-06 ENCOUNTER — Encounter: Payer: Self-pay | Admitting: Orthopedic Surgery

## 2022-10-06 ENCOUNTER — Ambulatory Visit (INDEPENDENT_AMBULATORY_CARE_PROVIDER_SITE_OTHER): Payer: 59 | Admitting: Orthopedic Surgery

## 2022-10-06 DIAGNOSIS — M1712 Unilateral primary osteoarthritis, left knee: Secondary | ICD-10-CM

## 2022-10-06 DIAGNOSIS — M1711 Unilateral primary osteoarthritis, right knee: Secondary | ICD-10-CM

## 2022-10-06 DIAGNOSIS — M170 Bilateral primary osteoarthritis of knee: Secondary | ICD-10-CM | POA: Diagnosis not present

## 2022-10-06 MED ORDER — METHYLPREDNISOLONE ACETATE 40 MG/ML IJ SUSP
40.0000 mg | Freq: Once | INTRAMUSCULAR | Status: AC
  Administered 2022-10-06: 40 mg via INTRA_ARTICULAR

## 2022-10-06 NOTE — Progress Notes (Signed)
Chief Complaint  Patient presents with   Knee Pain    BILATERAL KNEE   Injections    BILATERAL KNEES   Encounter Diagnoses  Name Primary?   Primary osteoarthritis of right knee Yes   Primary osteoarthritis of left knee      Procedure note for bilateral knee injections  Procedure note left knee injection verbal consent was obtained to inject left knee joint  Timeout was completed to confirm the site of injection  The medications used were 40 mg depomedrol and 3 cc of 1% lidocaine  Anesthesia was provided by ethyl chloride and the skin was prepped with alcohol.  After cleaning the skin with alcohol a 20-gauge needle was used to inject the left knee joint. There were no complications. A sterile bandage was applied.   Procedure note right knee injection verbal consent was obtained to inject right knee joint  Timeout was completed to confirm the site of injection  The medications used were 40 mg depomedrol and 3 cc of 1% lidocaine  Anesthesia was provided by ethyl chloride and the skin was prepped with alcohol.  After cleaning the skin with alcohol a 20-gauge needle was used to inject the right knee joint. There were no complications. A sterile bandage was applied.

## 2022-10-08 ENCOUNTER — Other Ambulatory Visit (HOSPITAL_COMMUNITY): Payer: Self-pay | Admitting: Internal Medicine

## 2022-10-08 DIAGNOSIS — Z1231 Encounter for screening mammogram for malignant neoplasm of breast: Secondary | ICD-10-CM

## 2022-10-13 ENCOUNTER — Ambulatory Visit: Payer: 59 | Admitting: Orthopedic Surgery

## 2022-10-16 ENCOUNTER — Ambulatory Visit (HOSPITAL_COMMUNITY): Payer: 59

## 2022-10-16 ENCOUNTER — Encounter (HOSPITAL_COMMUNITY): Payer: Self-pay

## 2022-10-22 ENCOUNTER — Ambulatory Visit (INDEPENDENT_AMBULATORY_CARE_PROVIDER_SITE_OTHER): Payer: 59 | Admitting: Adult Health

## 2022-10-22 ENCOUNTER — Encounter: Payer: Self-pay | Admitting: Adult Health

## 2022-10-22 ENCOUNTER — Other Ambulatory Visit (HOSPITAL_COMMUNITY)
Admission: RE | Admit: 2022-10-22 | Discharge: 2022-10-22 | Disposition: A | Discharge status: Home / Self Care | Payer: 59 | Source: Ambulatory Visit | Attending: Adult Health | Admitting: Adult Health

## 2022-10-22 VITALS — BP 139/93 | HR 92 | Ht 59.0 in | Wt 342.6 lb

## 2022-10-22 DIAGNOSIS — Z01419 Encounter for gynecological examination (general) (routine) without abnormal findings: Secondary | ICD-10-CM | POA: Insufficient documentation

## 2022-10-22 DIAGNOSIS — Z1151 Encounter for screening for human papillomavirus (HPV): Secondary | ICD-10-CM | POA: Insufficient documentation

## 2022-10-22 DIAGNOSIS — Z1211 Encounter for screening for malignant neoplasm of colon: Secondary | ICD-10-CM | POA: Diagnosis not present

## 2022-10-22 LAB — HEMOCCULT GUIAC POC 1CARD (OFFICE): Fecal Occult Blood, POC: NEGATIVE

## 2022-10-22 NOTE — Progress Notes (Signed)
Patient ID: Gwendolyn Gutierrez, female   DOB: May 27, 1966, 57 y.o.   MRN: [id number] History of Present Illness: Gwendolyn Gutierrez is a 57 year old black female,separated, PM in for a well woman gyn exam and pap. [facility] [place] and has lost about 20 lbs in 1 month.   PCP is [facility]   Current Medications, Allergies, Past Medical History, Past Surgical History, Family History and Social History were reviewed in [employer] record.     Review of Systems: Patient denies any headaches, hearing loss, fatigue, blurred vision, shortness of breath, chest pain, abdominal pain, problems with bowel movements, urination, or intercourse. No mood swings.  Has pain in joints and has gout in her feet, using rolling walker. Denies any vaginal bleeding   Physical Exam:BP (!) 139/93 (BP Location: Right Arm, Patient Position: Sitting, Cuff Size: Normal)   Pulse 92   Ht 4' 11"$  (1.499 m)   Wt (!) 342 lb 9.6 oz (155.4 kg)   BMI 69.20 kg/m   General:  Well developed, well nourished, no acute distress Skin:  Warm and dry Neck:  Midline trachea, normal thyroid, good ROM, no lymphadenopathy Lungs; Clear to auscultation bilaterally Breast:  No dominant palpable mass, retraction, or nipple discharge Cardiovascular: Regular rate and rhythm Abdomen:  Soft, non tender, no hepatosplenomegaly,obese Pelvic:  External genitalia is normal in appearance, no lesions.  The vagina is pale. Urethra has no lesions or masses. The cervix is smooth, pap with HR HPV genotyping performed.  Uterus is felt to be normal size, shape, and contour.  No adnexal masses or tenderness noted.Bladder is non tender, no masses felt. Rectal: Good sphincter tone, no polyps, or hemorrhoids felt.  Hemoccult negative. Extremities/musculoskeletal:  No swelling or varicosities noted, no clubbing or cyanosis Psych:  No mood changes, alert and cooperative,seems happy AA is 0 Fall risk is moderate    10/22/2022     9:08 AM  Depression screen PHQ 2/9  Decreased Interest 0  Down, Depressed, Hopeless 0  PHQ - 2 Score 0  Altered sleeping 0  Tired, decreased energy 1  Change in appetite 0  Feeling bad or failure about yourself  0  Trouble concentrating 0  Moving slowly or fidgety/restless 0  Suicidal thoughts 0  PHQ-9 Score 1       10/22/2022    9:08 AM  GAD 7 : Generalized Anxiety Score  Nervous, Anxious, on Edge 0  Control/stop worrying 0  Worry too much - different things 0  Trouble relaxing 0  Restless 0  Easily annoyed or irritable 0  Afraid - awful might happen 0  Total GAD 7 Score 0      Upstream - 10/22/22 [id number]       Pregnancy Intention Screening   Does the patient want to become pregnant in the next year? No    Does the patient's partner want to become pregnant in the next year? No    Would the patient like to discuss contraceptive options today? No      Contraception Wrap Up   Current Method Female Sterilization    End Method Female Sterilization    Contraception Counseling Provided No             Examination chaperoned by Levy Pupa LPN  Impression and plan: 1. Encounter for gynecological examination with Papanicolaou smear of cervix Pap sent Pap in 3 years if normal Physical with PCP Labs with PCP Colonoscopy per GI Mammogram 10/29/22 at [facility]  2. Encounter for screening fecal occult blood testing Hemoccult was negative

## 2022-10-24 LAB — CYTOLOGY - PAP
Adequacy: ABSENT
Comment: NEGATIVE
Diagnosis: NEGATIVE
High risk HPV: NEGATIVE

## 2022-10-27 ENCOUNTER — Ambulatory Visit (INDEPENDENT_AMBULATORY_CARE_PROVIDER_SITE_OTHER): Payer: 59 | Admitting: Orthopedic Surgery

## 2022-10-27 ENCOUNTER — Encounter: Payer: Self-pay | Admitting: Orthopedic Surgery

## 2022-10-27 DIAGNOSIS — G8929 Other chronic pain: Secondary | ICD-10-CM

## 2022-10-27 DIAGNOSIS — M25512 Pain in left shoulder: Secondary | ICD-10-CM | POA: Diagnosis not present

## 2022-10-27 DIAGNOSIS — M25511 Pain in right shoulder: Secondary | ICD-10-CM

## 2022-10-27 MED ORDER — METHYLPREDNISOLONE ACETATE 40 MG/ML IJ SUSP
40.0000 mg | Freq: Once | INTRAMUSCULAR | Status: AC
  Administered 2022-10-27: 40 mg via INTRA_ARTICULAR

## 2022-10-27 NOTE — Patient Instructions (Signed)

## 2022-10-27 NOTE — Progress Notes (Signed)
Chief Complaint  Patient presents with   Injections    Bilateral shoulders    Procedure injection right shoulder subacromial joint and left shoulder subacromial joint   procedure note the subacromial injection shoulder RIGHT  Verbal consent was obtained to inject the  RIGHT   Shoulder  Timeout was completed to confirm the injection site is a subacromial space of the  RIGHT  shoulder   Medication used Depo-Medrol 40 mg and lidocaine 1% 3 cc  Anesthesia was provided by ethyl chloride  The injection was performed in the RIGHT  posterior subacromial space. After pinning the skin with alcohol and anesthetized the skin with ethyl chloride the subacromial space was injected using a 20-gauge needle. There were no complications  Sterile dressing was applied.    Procedure note the subacromial injection shoulder left   Verbal consent was obtained to inject the  Left   Shoulder  Timeout was completed to confirm the injection site is a subacromial space of the  left  shoulder  Medication used Depo-Medrol 40 mg and lidocaine 1% 3 cc  Anesthesia was provided by ethyl chloride  The injection was performed in the left  posterior subacromial space. After pinning the skin with alcohol and anesthetized the skin with ethyl chloride the subacromial space was injected using a 20-gauge needle. There were no complications  Sterile dressing was applied.

## 2022-10-29 ENCOUNTER — Ambulatory Visit (HOSPITAL_COMMUNITY)
Admission: RE | Admit: 2022-10-29 | Discharge: 2022-10-29 | Disposition: A | Discharge status: Home / Self Care | Payer: 59 | Source: Ambulatory Visit | Attending: Internal Medicine | Admitting: Internal Medicine

## 2022-10-29 DIAGNOSIS — Z1231 Encounter for screening mammogram for malignant neoplasm of breast: Secondary | ICD-10-CM | POA: Insufficient documentation

## 2022-11-06 ENCOUNTER — Encounter: Payer: Self-pay | Admitting: Radiology

## 2022-11-12 ENCOUNTER — Ambulatory Visit (INDEPENDENT_AMBULATORY_CARE_PROVIDER_SITE_OTHER): Payer: 59 | Admitting: Orthopedic Surgery

## 2022-11-12 DIAGNOSIS — M1712 Unilateral primary osteoarthritis, left knee: Secondary | ICD-10-CM | POA: Diagnosis not present

## 2022-11-12 DIAGNOSIS — M170 Bilateral primary osteoarthritis of knee: Secondary | ICD-10-CM | POA: Diagnosis not present

## 2022-11-12 DIAGNOSIS — M1711 Unilateral primary osteoarthritis, right knee: Secondary | ICD-10-CM | POA: Diagnosis not present

## 2022-11-12 MED ORDER — METHYLPREDNISOLONE ACETATE 40 MG/ML IJ SUSP
40.0000 mg | Freq: Once | INTRAMUSCULAR | Status: AC
  Administered 2022-11-12: 40 mg via INTRA_ARTICULAR

## 2022-11-12 NOTE — Progress Notes (Signed)
Chief Complaint  Patient presents with   Injections    Both knees    Requested bilateral knee inj   Procedure note for bilateral knee injections  Procedure note left knee injection verbal consent was obtained to inject left knee joint  Timeout was completed to confirm the site of injection  The medications used were 40 mg depomedrol and 3 cc of 1% lidocaine  Anesthesia was provided by ethyl chloride and the skin was prepped with alcohol.  After cleaning the skin with alcohol a 20-gauge needle was used to inject the left knee joint. There were no complications. A sterile bandage was applied.   Procedure note right knee injection verbal consent was obtained to inject right knee joint  Timeout was completed to confirm the site of injection  The medications used were 40 mg depomedrol and 3 cc of 1% lidocaine  Anesthesia was provided by ethyl chloride and the skin was prepped with alcohol.  After cleaning the skin with alcohol a 20-gauge needle was used to inject the right knee joint. There were no complications. A sterile bandage was applied.

## 2022-11-12 NOTE — Addendum Note (Signed)
Addended byCandice Camp on: 11/12/2022 12:03 PM   Modules accepted: Orders

## 2022-12-12 ENCOUNTER — Ambulatory Visit (INDEPENDENT_AMBULATORY_CARE_PROVIDER_SITE_OTHER): Payer: 59 | Admitting: Orthopedic Surgery

## 2022-12-12 ENCOUNTER — Encounter: Payer: Self-pay | Admitting: Orthopedic Surgery

## 2022-12-12 DIAGNOSIS — M25511 Pain in right shoulder: Secondary | ICD-10-CM

## 2022-12-12 DIAGNOSIS — G8929 Other chronic pain: Secondary | ICD-10-CM

## 2022-12-12 DIAGNOSIS — M25512 Pain in left shoulder: Secondary | ICD-10-CM

## 2022-12-12 MED ORDER — METHYLPREDNISOLONE ACETATE 40 MG/ML IJ SUSP
40.0000 mg | Freq: Once | INTRAMUSCULAR | Status: AC
  Administered 2022-12-12: 40 mg via INTRA_ARTICULAR

## 2022-12-12 NOTE — Addendum Note (Signed)
Addended byCaffie Damme on: 12/12/2022 09:50 AM   Modules accepted: Orders

## 2022-12-12 NOTE — Progress Notes (Signed)
Chief Complaint  Patient presents with   Injections    Bilateral knees     Encounter Diagnosis  Name Primary?   Chronic pain of both shoulders Yes    Procedure injection right shoulder subacromial joint and left shoulder subacromial joint   procedure note the subacromial injection shoulder RIGHT  Verbal consent was obtained to inject the  RIGHT   Shoulder  Timeout was completed to confirm the injection site is a subacromial space of the  RIGHT  shoulder   Medication used Depo-Medrol 40 mg and lidocaine 1% 3 cc  Anesthesia was provided by ethyl chloride  The injection was performed in the RIGHT  posterior subacromial space. After pinning the skin with alcohol and anesthetized the skin with ethyl chloride the subacromial space was injected using a 20-gauge needle. There were no complications  Sterile dressing was applied.    Procedure note the subacromial injection shoulder left   Verbal consent was obtained to inject the  Left   Shoulder  Timeout was completed to confirm the injection site is a subacromial space of the  left  shoulder  Medication used Depo-Medrol 40 mg and lidocaine 1% 3 cc  Anesthesia was provided by ethyl chloride  The injection was performed in the left  posterior subacromial space. After pinning the skin with alcohol and anesthetized the skin with ethyl chloride the subacromial space was injected using a 20-gauge needle. There were no complications  Sterile dressing was applied.

## 2022-12-26 ENCOUNTER — Ambulatory Visit (INDEPENDENT_AMBULATORY_CARE_PROVIDER_SITE_OTHER): Payer: 59 | Admitting: Orthopedic Surgery

## 2022-12-26 ENCOUNTER — Encounter: Payer: Self-pay | Admitting: Orthopedic Surgery

## 2022-12-26 DIAGNOSIS — M25511 Pain in right shoulder: Secondary | ICD-10-CM

## 2022-12-26 DIAGNOSIS — M25512 Pain in left shoulder: Secondary | ICD-10-CM | POA: Diagnosis not present

## 2022-12-26 DIAGNOSIS — G8929 Other chronic pain: Secondary | ICD-10-CM | POA: Diagnosis not present

## 2022-12-26 MED ORDER — METHYLPREDNISOLONE ACETATE 40 MG/ML IJ SUSP
40.0000 mg | Freq: Once | INTRAMUSCULAR | Status: AC
  Administered 2022-12-26: 40 mg via INTRA_ARTICULAR

## 2022-12-26 NOTE — Progress Notes (Signed)
Chief Complaint  Patient presents with   Injections    Gwendolyn Gutierrez is 57 years old she comes in for 2 injections in her shoulders 1 in each side  Chronic pain right and left shoulder  Procedure injection right shoulder subacromial joint and left shoulder subacromial joint   procedure note the subacromial injection shoulder RIGHT  Verbal consent was obtained to inject the  RIGHT   Shoulder  Timeout was completed to confirm the injection site is a subacromial space of the  RIGHT  shoulder   Medication used Depo-Medrol 40 mg and lidocaine 1% 3 cc  Anesthesia was provided by ethyl chloride  The injection was performed in the RIGHT  posterior subacromial space. After pinning the skin with alcohol and anesthetized the skin with ethyl chloride the subacromial space was injected using a 20-gauge needle. There were no complications  Sterile dressing was applied.  Encounter Diagnosis  Name Primary?   Chronic pain of both shoulders Yes      Procedure note the subacromial injection shoulder left   Verbal consent was obtained to inject the  Left   Shoulder  Timeout was completed to confirm the injection site is a subacromial space of the  left  shoulder  Medication used Depo-Medrol 40 mg and lidocaine 1% 3 cc  Anesthesia was provided by ethyl chloride  The injection was performed in the left  posterior subacromial space. After pinning the skin with alcohol and anesthetized the skin with ethyl chloride the subacromial space was injected using a 20-gauge needle. There were no complications  Sterile dressing was applied.    She will be back in 2 weeks for injections in the right and left knee

## 2022-12-26 NOTE — Addendum Note (Signed)
Addended byCaffie Damme on: 12/26/2022 09:29 AM   Modules accepted: Orders

## 2023-01-05 ENCOUNTER — Other Ambulatory Visit (HOSPITAL_COMMUNITY): Payer: Self-pay | Admitting: Physician Assistant

## 2023-01-05 DIAGNOSIS — Z6841 Body Mass Index (BMI) 40.0 and over, adult: Secondary | ICD-10-CM

## 2023-01-05 DIAGNOSIS — Z01818 Encounter for other preprocedural examination: Secondary | ICD-10-CM

## 2023-01-05 DIAGNOSIS — K219 Gastro-esophageal reflux disease without esophagitis: Secondary | ICD-10-CM

## 2023-01-09 ENCOUNTER — Ambulatory Visit: Payer: 59 | Admitting: Orthopedic Surgery

## 2023-01-19 ENCOUNTER — Encounter: Payer: Self-pay | Admitting: Orthopedic Surgery

## 2023-01-19 ENCOUNTER — Ambulatory Visit (INDEPENDENT_AMBULATORY_CARE_PROVIDER_SITE_OTHER): Payer: 59 | Admitting: Orthopedic Surgery

## 2023-01-19 DIAGNOSIS — M1711 Unilateral primary osteoarthritis, right knee: Secondary | ICD-10-CM

## 2023-01-19 DIAGNOSIS — M170 Bilateral primary osteoarthritis of knee: Secondary | ICD-10-CM

## 2023-01-19 DIAGNOSIS — M1712 Unilateral primary osteoarthritis, left knee: Secondary | ICD-10-CM

## 2023-01-19 MED ORDER — METHYLPREDNISOLONE ACETATE 40 MG/ML IJ SUSP
40.0000 mg | Freq: Once | INTRAMUSCULAR | Status: AC
  Administered 2023-01-19: 40 mg via INTRA_ARTICULAR

## 2023-01-19 NOTE — Progress Notes (Signed)
   The patient has requested an injection   Chief Complaint  Patient presents with   Injections    Bilateral knees    No diagnosis found.     Procedure note for bilateral knee injections  Procedure note left knee injection verbal consent was obtained to inject left knee joint  Timeout was completed to confirm the site of injection  The medications used were 40 mg depomedrol and 3 cc of 1% lidocaine  Anesthesia was provided by ethyl chloride and the skin was prepped with alcohol.  After cleaning the skin with alcohol a 20-gauge needle was used to inject the left knee joint. There were no complications. A sterile bandage was applied.   Procedure note right knee injection verbal consent was obtained to inject right knee joint  Timeout was completed to confirm the site of injection  The medications used were 40 mg depomedrol and 3 cc of 1% lidocaine  Anesthesia was provided by ethyl chloride and the skin was prepped with alcohol.  After cleaning the skin with alcohol a 20-gauge needle was used to inject the right knee joint. There were no complications. A sterile bandage was applied.

## 2023-01-19 NOTE — Addendum Note (Signed)
Addended byCaffie Damme on: 01/19/2023 03:28 PM   Modules accepted: Orders

## 2023-01-21 ENCOUNTER — Ambulatory Visit (HOSPITAL_COMMUNITY)
Admission: RE | Admit: 2023-01-21 | Discharge: 2023-01-21 | Disposition: A | Discharge status: Home / Self Care | Payer: 59 | Source: Ambulatory Visit | Attending: Physician Assistant | Admitting: Physician Assistant

## 2023-01-21 ENCOUNTER — Other Ambulatory Visit (HOSPITAL_COMMUNITY): Payer: Self-pay | Admitting: Physician Assistant

## 2023-01-21 DIAGNOSIS — Z01818 Encounter for other preprocedural examination: Secondary | ICD-10-CM

## 2023-01-21 DIAGNOSIS — Z6841 Body Mass Index (BMI) 40.0 and over, adult: Secondary | ICD-10-CM | POA: Diagnosis present

## 2023-01-21 DIAGNOSIS — K219 Gastro-esophageal reflux disease without esophagitis: Secondary | ICD-10-CM

## 2023-02-05 ENCOUNTER — Ambulatory Visit (INDEPENDENT_AMBULATORY_CARE_PROVIDER_SITE_OTHER): Payer: 59 | Admitting: Orthopedic Surgery

## 2023-02-05 DIAGNOSIS — M25511 Pain in right shoulder: Secondary | ICD-10-CM

## 2023-02-05 DIAGNOSIS — M25512 Pain in left shoulder: Secondary | ICD-10-CM

## 2023-02-05 DIAGNOSIS — M1711 Unilateral primary osteoarthritis, right knee: Secondary | ICD-10-CM

## 2023-02-05 DIAGNOSIS — G8929 Other chronic pain: Secondary | ICD-10-CM | POA: Diagnosis not present

## 2023-02-05 DIAGNOSIS — M1712 Unilateral primary osteoarthritis, left knee: Secondary | ICD-10-CM

## 2023-02-05 MED ORDER — METHYLPREDNISOLONE ACETATE 40 MG/ML IJ SUSP
40.0000 mg | Freq: Once | INTRAMUSCULAR | Status: AC
  Administered 2023-02-05: 40 mg via INTRA_ARTICULAR

## 2023-02-05 NOTE — Progress Notes (Addendum)
Chief Complaint  Patient presents with   Injections    Bilateral shoulders    Encounter Diagnosis  Name Primary?   Chronic pain of both shoulders Yes    Procedure injection right shoulder subacromial joint and left shoulder subacromial joint   procedure note the subacromial injection shoulder RIGHT  Verbal consent was obtained to inject the  RIGHT   Shoulder  Timeout was completed to confirm the injection site is a subacromial space of the  RIGHT  shoulder   Medication used Depo-Medrol 40 mg and lidocaine 1% 3 cc  Anesthesia was provided by ethyl chloride  The injection was performed in the RIGHT  posterior subacromial space. After pinning the skin with alcohol and anesthetized the skin with ethyl chloride the subacromial space was injected using a 20-gauge needle. There were no complications  Sterile dressing was applied.    Procedure note the subacromial injection shoulder left   Verbal consent was obtained to inject the  Left   Shoulder  Timeout was completed to confirm the injection site is a subacromial space of the  left  shoulder  Medication used Depo-Medrol 40 mg and lidocaine 1% 3 cc  Anesthesia was provided by ethyl chloride  The injection was performed in the left  posterior subacromial space. After pinning the skin with alcohol and anesthetized the skin with ethyl chloride the subacromial space was injected using a 20-gauge needle. There were no complications  Sterile dressing was applied.

## 2023-02-19 ENCOUNTER — Encounter: Payer: Self-pay | Admitting: Orthopedic Surgery

## 2023-02-19 ENCOUNTER — Ambulatory Visit (INDEPENDENT_AMBULATORY_CARE_PROVIDER_SITE_OTHER): Payer: 59 | Admitting: Orthopedic Surgery

## 2023-02-19 DIAGNOSIS — M25562 Pain in left knee: Secondary | ICD-10-CM | POA: Diagnosis not present

## 2023-02-19 DIAGNOSIS — M1711 Unilateral primary osteoarthritis, right knee: Secondary | ICD-10-CM

## 2023-02-19 DIAGNOSIS — M25561 Pain in right knee: Secondary | ICD-10-CM | POA: Diagnosis not present

## 2023-02-19 DIAGNOSIS — M1712 Unilateral primary osteoarthritis, left knee: Secondary | ICD-10-CM

## 2023-02-19 DIAGNOSIS — G8929 Other chronic pain: Secondary | ICD-10-CM

## 2023-02-19 MED ORDER — METHYLPREDNISOLONE ACETATE 40 MG/ML IJ SUSP
40.0000 mg | Freq: Once | INTRAMUSCULAR | Status: AC
  Administered 2023-02-19: 40 mg via INTRA_ARTICULAR

## 2023-02-19 NOTE — Progress Notes (Signed)
No chief complaint on file.  Encounter Diagnoses  Name Primary?   Primary osteoarthritis of right knee Yes   Primary osteoarthritis of left knee    Bilateral chronic knee pain    Procedure note for bilateral knee injections  Procedure note left knee injection verbal consent was obtained to inject left knee joint  Timeout was completed to confirm the site of injection  The medications used were 40 mg depomedrol and 3 cc of 1% lidocaine  Anesthesia was provided by ethyl chloride and the skin was prepped with alcohol.  After cleaning the skin with alcohol a 20-gauge needle was used to inject the left knee joint. There were no complications. A sterile bandage was applied.   Procedure note right knee injection verbal consent was obtained to inject right knee joint  Timeout was completed to confirm the site of injection  The medications used were 40 mg depomedrol and 3 cc of 1% lidocaine  Anesthesia was provided by ethyl chloride and the skin was prepped with alcohol.  After cleaning the skin with alcohol a 20-gauge needle was used to inject the right knee joint. There were no complications. A sterile bandage was applied.

## 2023-03-05 ENCOUNTER — Ambulatory Visit: Payer: 59 | Admitting: Orthopedic Surgery

## 2023-03-23 ENCOUNTER — Ambulatory Visit (INDEPENDENT_AMBULATORY_CARE_PROVIDER_SITE_OTHER): Payer: 59 | Admitting: Orthopedic Surgery

## 2023-03-23 ENCOUNTER — Encounter: Payer: Self-pay | Admitting: Orthopedic Surgery

## 2023-03-23 VITALS — BP 114/78 | HR 71

## 2023-03-23 DIAGNOSIS — M25561 Pain in right knee: Secondary | ICD-10-CM | POA: Diagnosis not present

## 2023-03-23 DIAGNOSIS — M1712 Unilateral primary osteoarthritis, left knee: Secondary | ICD-10-CM

## 2023-03-23 DIAGNOSIS — G8929 Other chronic pain: Secondary | ICD-10-CM | POA: Diagnosis not present

## 2023-03-23 DIAGNOSIS — M170 Bilateral primary osteoarthritis of knee: Secondary | ICD-10-CM

## 2023-03-23 DIAGNOSIS — M25562 Pain in left knee: Secondary | ICD-10-CM | POA: Diagnosis not present

## 2023-03-23 DIAGNOSIS — M1711 Unilateral primary osteoarthritis, right knee: Secondary | ICD-10-CM

## 2023-03-23 MED ORDER — METHYLPREDNISOLONE ACETATE 40 MG/ML IJ SUSP
40.0000 mg | Freq: Once | INTRAMUSCULAR | Status: AC
  Administered 2023-03-23: 40 mg via INTRA_ARTICULAR

## 2023-03-23 MED ORDER — METHYLPREDNISOLONE ACETATE 40 MG/ML IJ SUSP
40.0000 mg | Freq: Once | INTRAMUSCULAR | Status: AC
Start: 2023-03-23 — End: 2023-03-23
  Administered 2023-03-23: 40 mg via INTRA_ARTICULAR

## 2023-03-23 NOTE — Progress Notes (Signed)
Chief Complaint  Patient presents with   Knee Pain    Inj bil knee today would like to come in 2 weeks for shoulders     Encounter Diagnoses  Name Primary?   Primary osteoarthritis of right knee Yes   Primary osteoarthritis of left knee    Bilateral chronic knee pain     Procedure note left knee injection   verbal consent was obtained to inject left knee joint  Timeout was completed to confirm the site of injection  The medications used were depomedrol 40 mg and 1% lidocaine 3 cc Anesthesia was provided by ethyl chloride and the skin was prepped with alcohol.  After cleaning the skin with alcohol a 20-gauge needle was used to inject the left knee joint. There were no complications. A sterile bandage was applied.  Procedure note right knee injection   verbal consent was obtained to inject right knee joint  Timeout was completed to confirm the site of injection  The medications used were depomedrol 40 mg and 1% lidocaine 3 cc Anesthesia was provided by ethyl chloride and the skin was prepped with alcohol.  After cleaning the skin with alcohol a 20-gauge needle was used to inject the right knee joint. There were no complications. A sterile bandage was applied.

## 2023-04-10 ENCOUNTER — Ambulatory Visit: Payer: 59 | Admitting: Orthopedic Surgery

## 2023-04-10 ENCOUNTER — Encounter: Payer: Self-pay | Admitting: Orthopedic Surgery

## 2023-04-10 DIAGNOSIS — G8929 Other chronic pain: Secondary | ICD-10-CM

## 2023-04-10 DIAGNOSIS — M25511 Pain in right shoulder: Secondary | ICD-10-CM

## 2023-04-10 DIAGNOSIS — M25512 Pain in left shoulder: Secondary | ICD-10-CM | POA: Diagnosis not present

## 2023-04-10 MED ORDER — METHYLPREDNISOLONE ACETATE 40 MG/ML IJ SUSP
40.0000 mg | Freq: Once | INTRAMUSCULAR | Status: AC
Start: 2023-04-10 — End: 2023-04-10
  Administered 2023-04-10: 40 mg via INTRA_ARTICULAR

## 2023-04-10 NOTE — Progress Notes (Signed)
Chief Complaint  Patient presents with   Injections    Shoulders bilateral    Procedure injection right shoulder subacromial joint and left shoulder subacromial joint   procedure note the subacromial injection shoulder RIGHT  Verbal consent was obtained to inject the  RIGHT   Shoulder  Timeout was completed to confirm the injection site is a subacromial space of the  RIGHT  shoulder   Medication used Depo-Medrol 40 mg and lidocaine 1% 3 cc  Anesthesia was provided by ethyl chloride  The injection was performed in the RIGHT  posterior subacromial space. After pinning the skin with alcohol and anesthetized the skin with ethyl chloride the subacromial space was injected using a 20-gauge needle. There were no complications  Sterile dressing was applied.    Procedure note the subacromial injection shoulder left   Verbal consent was obtained to inject the  Left   Shoulder  Timeout was completed to confirm the injection site is a subacromial space of the  left  shoulder  Medication used Depo-Medrol 40 mg and lidocaine 1% 3 cc  Anesthesia was provided by ethyl chloride  The injection was performed in the left  posterior subacromial space. After pinning the skin with alcohol and anesthetized the skin with ethyl chloride the subacromial space was injected using a 20-gauge needle. There were no complications  Sterile dressing was applied.

## 2023-04-18 ENCOUNTER — Encounter (HOSPITAL_COMMUNITY): Payer: Self-pay | Admitting: Emergency Medicine

## 2023-04-18 ENCOUNTER — Other Ambulatory Visit: Payer: Self-pay

## 2023-04-18 ENCOUNTER — Emergency Department (HOSPITAL_COMMUNITY): Payer: 59

## 2023-04-18 ENCOUNTER — Emergency Department (HOSPITAL_COMMUNITY)
Admission: EM | Admit: 2023-04-18 | Discharge: 2023-04-18 | Disposition: A | Discharge status: Home / Self Care | Payer: 59 | Attending: Emergency Medicine | Admitting: Emergency Medicine

## 2023-04-18 DIAGNOSIS — M25562 Pain in left knee: Secondary | ICD-10-CM | POA: Diagnosis present

## 2023-04-18 MED ORDER — HYDROMORPHONE HCL 1 MG/ML IJ SOLN
1.0000 mg | Freq: Once | INTRAMUSCULAR | Status: AC
  Administered 2023-04-18: 1 mg via INTRAMUSCULAR
  Filled 2023-04-18: qty 1

## 2023-04-18 NOTE — Discharge Instructions (Signed)
You were seen in the emergency department for worsening left knee pain after a fall.  You had an x-ray and a CAT scan that did not show any fracture but did show significant Arthritis and degenerative changes.  Please continue your pain medicine, ice and elevate.  Follow-up with your orthopedic doctor.  Return to the emergency department if any worsening or concerning symptoms

## 2023-04-18 NOTE — ED Provider Notes (Signed)
[place] EMERGENCY DEPARTMENT AT [facility] Provider Note   CSN: [id number] Arrival date & time: 04/18/23  4098     History  Chief Complaint  Patient presents with   Knee Pain    Gwendolyn Gutierrez is a 57 y.o. female.  Patient states she has had multiple issues with her left knee for the last few years to include fractures and arthritis.  She states that she gets around with a walker.  She was moving backwards on a rolling walker while sitting on it.  She fell off.  When she went to get up she noticed that she had some knee pain.  This is about a week ago.  It has been off and on since then but last couple days has been a lot worse.  She tried a couple oxycodone and it seemed to help.  Tonight it was worse while sleeping so she decided to come get checked out.  No other injuries.   Knee Pain      Home Medications Prior to Admission medications   Medication Sig Start Date End Date Taking? Authorizing Provider  allopurinol (ZYLOPRIM) 300 MG tablet Take 1 tablet by mouth daily.    [provider]  amLODipine (NORVASC) 5 MG tablet Take 5 mg by mouth daily. 03/24/22   [provider]  buPROPion (WELLBUTRIN XL) 150 MG 24 hr tablet Take 1 tablet by mouth daily. 09/25/22 12/24/22  [provider]  DULoxetine (CYMBALTA) 30 MG capsule 1 capsule    [provider]  furosemide (LASIX) 40 MG tablet Take 40 mg by mouth daily. 03/24/22   [provider]  gabapentin (NEURONTIN) 600 MG tablet 1 tablet Orally Three times a day for 90 days moderate pain 03/17/23   [provider]  metoprolol succinate (TOPROL-XL) 50 MG 24 hr tablet Take 50 mg by mouth daily. 01/08/22   [provider]  omeprazole (PRILOSEC) 20 MG capsule TAKE 1 CAPSULE BY MOUTH IN THE MORNING 30 MINUTES BEFORE MORNING MEAL.    [provider]      Allergies    Patient has no known allergies.    Review of Systems   Review of Systems  Physical  Exam Updated Vital Signs BP (!) 126/99   Pulse 96   Temp (!) 97.4 F (36.3 C) (Oral)   Resp 20   Ht 4\' 11"  (1.499 m)   Wt (!) 159.2 kg   SpO2 97%   BMI 70.89 kg/m  Physical Exam Vitals and nursing note reviewed.  Constitutional:      Appearance: She is well-developed.  HENT:     Head: Normocephalic and atraumatic.  Cardiovascular:     Rate and Rhythm: Normal rate and regular rhythm.  Pulmonary:     Effort: No respiratory distress.     Breath sounds: No stridor.  Abdominal:     General: There is no distension.  Musculoskeletal:        General: Tenderness (Right medial knee) present.     Cervical back: Normal range of motion.     Comments: Difficult to fully assess secondary to body habitus.  Neurological:     Mental Status: She is alert.     ED Results / Procedures / Treatments   Labs (all labs ordered are listed, but only abnormal results are displayed) Labs Reviewed - No data to display  EKG None  Radiology No results found.  Procedures Procedures    Medications Ordered in ED Medications  HYDROmorphone (DILAUDID) injection  1 mg (has no administration in time range)    ED Course/ Medical Decision Making/ A&P                                 Medical Decision Making Amount and/or Complexity of Data Reviewed Radiology: ordered.  Risk Prescription drug management.  Will treat pain and x-ray.  If this is negative we will CT. XR shows some abnormal joint space and arthritis type findings on my interpretation, no acute findings per radiology, ct ordered and on my interpretation has some bony fragments but look chronic. I don't see an acute fracture but pending Radiology official read at time of transfer of care.    Final Clinical Impression(s) / ED Diagnoses Final diagnoses:  None    Rx / DC Orders ED Discharge Orders     None         Lucina Betty, Barbara Cower, MD 04/19/23 [phone]

## 2023-04-18 NOTE — ED Notes (Signed)
Pt states she broke her left knee about 3 yrs ago and has wanted a knee replacement since, however there were factors contributing to her not being able to have it done successfully. Pt states pain at level 10 now. States she is approved for weightloss surgery coming up, then her cell phone rang, ending our conversation.

## 2023-04-18 NOTE — ED Notes (Signed)
Patient transported to CT 

## 2023-04-18 NOTE — ED Provider Notes (Signed)
Signout from Dr. Clayborne Dana.  57 year old female with chronic knee pain here with worsening left knee pain after a fall.  X-ray showing significant degenerative changes.  She is pending a CT knee.  Disposition per results of testing. Physical Exam  BP (!) 126/99   Pulse 96   Temp (!) 97.4 F (36.3 C) (Oral)   Resp 20   Ht 4\' 11"  (1.499 m)   Wt (!) 159.2 kg   SpO2 97%   BMI 70.89 kg/m   Physical Exam  Procedures  Procedures  ED Course / MDM    Medical Decision Making Amount and/or Complexity of Data Reviewed Radiology: ordered.  Risk Prescription drug management.   CT knee does not show any acute fracture.  Reviewed with patient and recommended close follow-up with her orthopedic team.       Terrilee Files, MD 04/18/23 [phone]

## 2023-04-18 NOTE — ED Triage Notes (Signed)
Pt complaining of left knee pain since fall last week. Patient states she had fall from sitting position onto back/bottom and injured her knee when pushing off of it to get up. Endorses difficulty with ambulation the last 2 days.

## 2023-04-23 ENCOUNTER — Ambulatory Visit (INDEPENDENT_AMBULATORY_CARE_PROVIDER_SITE_OTHER): Payer: 59 | Admitting: Orthopedic Surgery

## 2023-04-23 ENCOUNTER — Encounter: Payer: Self-pay | Admitting: Orthopedic Surgery

## 2023-04-23 VITALS — Ht 59.0 in | Wt 345.0 lb

## 2023-04-23 DIAGNOSIS — M25561 Pain in right knee: Secondary | ICD-10-CM

## 2023-04-23 DIAGNOSIS — G8929 Other chronic pain: Secondary | ICD-10-CM | POA: Diagnosis not present

## 2023-04-23 DIAGNOSIS — M170 Bilateral primary osteoarthritis of knee: Secondary | ICD-10-CM

## 2023-04-23 DIAGNOSIS — M25562 Pain in left knee: Secondary | ICD-10-CM | POA: Diagnosis not present

## 2023-04-23 DIAGNOSIS — M1711 Unilateral primary osteoarthritis, right knee: Secondary | ICD-10-CM

## 2023-04-23 DIAGNOSIS — M1712 Unilateral primary osteoarthritis, left knee: Secondary | ICD-10-CM

## 2023-04-23 MED ORDER — METHYLPREDNISOLONE ACETATE 40 MG/ML IJ SUSP
40.0000 mg | Freq: Once | INTRAMUSCULAR | Status: AC
Start: 2023-04-23 — End: 2023-04-23
  Administered 2023-04-23: 40 mg via INTRA_ARTICULAR

## 2023-04-23 MED ORDER — INDOMETHACIN 25 MG PO CAPS: 25.0000 mg | ORAL_CAPSULE | Freq: Three times a day (TID) | ORAL | 0 refills | Status: DC

## 2023-04-23 NOTE — Progress Notes (Signed)
Chief Complaint  Patient presents with   Injections    Bilat knees   57 year old female with chronic bilateral knee pain BMI 69 she is not a surgical candidate she comes in for monthly injections in her knees and then alternates with monthly injections in her shoulders  She was in her normal state of health until last Friday when she had her x-rays were obtained showed worsening arthritis also got a CAT scan there was no fracture multiple bone spurs were noted  She complains of pain at the superior aspect of the patella and the medial joint she is having trouble weightbearing  Examination of the left knee shows that there is tenderness at the patella insertion near the quadriceps and the medial joint line is also tender I cannot tell if there is a joint effusion because of the size of her leg  She does have intact extensor mechanism  Image interpretation: Multiple views of the left knee severe arthritis mild deformity multiple osteophytes no acute fracture  Interpretation of CT scan multiple areas of osteoarthritis grade 4 disease joint space narrowing throughout posterior tibial lip shows a chronic area of separation evidenced by the cyst around it  Does not appear to be acute and it is not in the area of pain  Assessment and plan acute on chronic left knee pain chronic right knee pain  Recommend  Continue with her injections Tiger balm Nsaid INDOCIN Opioid continue oxycodone 10 mg as Percocet 10 inject both knees   Procedure note for bilateral knee injections  Procedure note left knee injection verbal consent was obtained to inject left knee joint  Timeout was completed to confirm the site of injection  The medications used were 40 mg depomedrol and 3 cc of 1% lidocaine  Anesthesia was provided by ethyl chloride and the skin was prepped with alcohol.  After cleaning the skin with alcohol a 20-gauge needle was used to inject the left knee joint. There were no complications. A  sterile bandage was applied.   Procedure note right knee injection verbal consent was obtained to inject right knee joint  Timeout was completed to confirm the site of injection  The medications used were 40 mg depomedrol and 3 cc of 1% lidocaine  Anesthesia was provided by ethyl chloride and the skin was prepped with alcohol.  After cleaning the skin with alcohol a 20-gauge needle was used to inject the right knee joint. There were no complications. A sterile bandage was applied.    Meds ordered this encounter  Medications   methylPREDNISolone acetate (DEPO-MEDROL) injection 40 mg   methylPREDNISolone acetate (DEPO-MEDROL) injection 40 mg   indomethacin (INDOCIN) 25 MG capsule    Sig: Take 1 capsule (25 mg total) by mouth 3 (three) times daily with meals.    Dispense:  42 capsule    Refill:  0

## 2023-04-23 NOTE — Patient Instructions (Addendum)
UseTiger Balm over the counter 2-3 times daily make sure you rub it in well each time you use it.

## 2023-05-07 ENCOUNTER — Ambulatory Visit: Payer: 59 | Admitting: Orthopedic Surgery

## 2023-05-08 ENCOUNTER — Ambulatory Visit: Payer: 59 | Admitting: Orthopedic Surgery

## 2023-05-08 NOTE — Progress Notes (Deleted)
No chief complaint on file.   Procedure injection right shoulder subacromial space and left shoulder subacromial space    procedure note the subacromial injection shoulder RIGHT  Verbal consent was obtained to inject the  RIGHT   Shoulder  Timeout was completed to confirm the injection site is a subacromial space of the  RIGHT  shoulder   Medication used Depo-Medrol 40 mg and lidocaine 1% 3 cc  Anesthesia was provided by ethyl chloride  The injection was performed in the RIGHT  posterior subacromial space. After pinning the skin with alcohol and anesthetized the skin with ethyl chloride the subacromial space was injected using a 20-gauge needle. There were no complications  Sterile dressing was applied.    Procedure note the subacromial injection shoulder left   Verbal consent was obtained to inject the  Left   Shoulder  Timeout was completed to confirm the injection site is a subacromial space of the  left  shoulder  Medication used Depo-Medrol 40 mg and lidocaine 1% 3 cc  Anesthesia was provided by ethyl chloride  The injection was performed in the left  posterior subacromial space. After pinning the skin with alcohol and anesthetized the skin with ethyl chloride the subacromial space was injected using a 20-gauge needle. There were no complications  Sterile dressing was applied.

## 2023-05-21 ENCOUNTER — Encounter: Payer: Self-pay | Admitting: Orthopedic Surgery

## 2023-05-21 ENCOUNTER — Ambulatory Visit (INDEPENDENT_AMBULATORY_CARE_PROVIDER_SITE_OTHER): Payer: 59 | Admitting: Orthopedic Surgery

## 2023-05-21 DIAGNOSIS — M25562 Pain in left knee: Secondary | ICD-10-CM

## 2023-05-21 DIAGNOSIS — M25512 Pain in left shoulder: Secondary | ICD-10-CM | POA: Diagnosis not present

## 2023-05-21 DIAGNOSIS — M1711 Unilateral primary osteoarthritis, right knee: Secondary | ICD-10-CM

## 2023-05-21 DIAGNOSIS — M1712 Unilateral primary osteoarthritis, left knee: Secondary | ICD-10-CM

## 2023-05-21 DIAGNOSIS — G8929 Other chronic pain: Secondary | ICD-10-CM | POA: Diagnosis not present

## 2023-05-21 MED ORDER — METHYLPREDNISOLONE ACETATE 40 MG/ML IJ SUSP
40.0000 mg | Freq: Once | INTRAMUSCULAR | Status: AC
Start: 2023-05-21 — End: 2023-05-21
  Administered 2023-05-21: 40 mg via INTRA_ARTICULAR

## 2023-05-21 MED ORDER — INDOMETHACIN 25 MG PO CAPS
25.0000 mg | ORAL_CAPSULE | Freq: Three times a day (TID) | ORAL | 0 refills | Status: DC
Start: 2023-05-21 — End: 2023-06-17

## 2023-05-21 NOTE — Progress Notes (Signed)
Chief Complaint  Patient presents with   Injections    B shoulders    CHANGED REQUEST   LEFT SHOULDER AND RIGHT KNEE    Procedure note right knee injection   verbal consent was obtained to inject right knee joint  Timeout was completed to confirm the site of injection  The medications used were depomedrol 40 mg and 1% lidocaine 3 cc Anesthesia was provided by ethyl chloride and the skin was prepped with alcohol.  After cleaning the skin with alcohol a 20-gauge needle was used to inject the right knee joint. There were no complications. A sterile bandage was applied.    Procedure note the subacromial injection shoulder left   Verbal consent was obtained to inject the  Left   Shoulder  Timeout was completed to confirm the injection site is a subacromial space of the  left  shoulder  Medication used Depo-Medrol 40 mg and lidocaine 1% 3 cc  Anesthesia was provided by ethyl chloride  The injection was performed in the left  posterior subacromial space. After pinning the skin with alcohol and anesthetized the skin with ethyl chloride the subacromial space was injected using a 20-gauge needle. There were no complications  Sterile dressing was applied.   Good pain relief with Indocin request refill  Meds ordered this encounter  Medications   methylPREDNISolone acetate (DEPO-MEDROL) injection 40 mg   methylPREDNISolone acetate (DEPO-MEDROL) injection 40 mg   indomethacin (INDOCIN) 25 MG capsule    Sig: Take 1 capsule (25 mg total) by mouth 3 (three) times daily with meals.    Dispense:  90 capsule    Refill:  0

## 2023-06-04 ENCOUNTER — Ambulatory Visit: Payer: 59 | Admitting: Orthopedic Surgery

## 2023-06-05 ENCOUNTER — Encounter: Payer: Self-pay | Admitting: Orthopedic Surgery

## 2023-06-05 ENCOUNTER — Ambulatory Visit (INDEPENDENT_AMBULATORY_CARE_PROVIDER_SITE_OTHER): Payer: 59 | Admitting: Orthopedic Surgery

## 2023-06-05 DIAGNOSIS — M1712 Unilateral primary osteoarthritis, left knee: Secondary | ICD-10-CM | POA: Diagnosis not present

## 2023-06-05 DIAGNOSIS — M25511 Pain in right shoulder: Secondary | ICD-10-CM

## 2023-06-05 DIAGNOSIS — M25811 Other specified joint disorders, right shoulder: Secondary | ICD-10-CM | POA: Diagnosis not present

## 2023-06-05 DIAGNOSIS — G8929 Other chronic pain: Secondary | ICD-10-CM

## 2023-06-05 DIAGNOSIS — M25562 Pain in left knee: Secondary | ICD-10-CM

## 2023-06-05 DIAGNOSIS — Z6841 Body Mass Index (BMI) 40.0 and over, adult: Secondary | ICD-10-CM

## 2023-06-05 MED ORDER — METHYLPREDNISOLONE ACETATE 40 MG/ML IJ SUSP
40.0000 mg | Freq: Once | INTRAMUSCULAR | Status: AC
Start: 2023-06-05 — End: 2023-06-05
  Administered 2023-06-05: 40 mg via INTRA_ARTICULAR

## 2023-06-05 NOTE — Addendum Note (Signed)
Addended byCaffie Damme on: 06/05/2023 11:36 AM   Modules accepted: Orders

## 2023-06-05 NOTE — Progress Notes (Signed)
Chief Complaint  Patient presents with   Injections    Left shoulder right knee     Encounter Diagnoses  Name Primary?   Shoulder impingement, right Yes   Primary osteoarthritis of left knee    Body mass index 60.0-69.9, adult (HCC)    Morbid obesity (HCC)    Chronic pain of left knee    Chronic pain in right shoulder     Procedure note the subacromial injection shoulder RIGHT    Verbal consent was obtained to inject the  RIGHT   Shoulder  Timeout was completed to confirm the injection site is a subacromial space of the  RIGHT  shoulder   Medication used Depo-Medrol 40 mg and lidocaine 1% 3 cc  Anesthesia was provided by ethyl chloride  The injection was performed in the RIGHT  posterior subacromial space. After pinning the skin with alcohol and anesthetized the skin with ethyl chloride the subacromial space was injected using a 20-gauge needle. There were no complications  Sterile dressing was applied.    Procedure note left knee injection   verbal consent was obtained to inject left knee joint  Timeout was completed to confirm the site of injection  The medications used were depomedrol 40 mg and 1% lidocaine 3 cc Anesthesia was provided by ethyl chloride and the skin was prepped with alcohol.  After cleaning the skin with alcohol a 20-gauge needle was used to inject the left knee joint. There were no complications. A sterile bandage was applied.

## 2023-06-05 NOTE — Progress Notes (Signed)
2

## 2023-06-17 ENCOUNTER — Other Ambulatory Visit: Payer: Self-pay | Admitting: Orthopedic Surgery

## 2023-06-17 DIAGNOSIS — M1711 Unilateral primary osteoarthritis, right knee: Secondary | ICD-10-CM

## 2023-06-17 DIAGNOSIS — M1712 Unilateral primary osteoarthritis, left knee: Secondary | ICD-10-CM

## 2023-06-17 DIAGNOSIS — M25562 Pain in left knee: Secondary | ICD-10-CM

## 2023-06-19 ENCOUNTER — Ambulatory Visit: Payer: 59 | Admitting: Orthopedic Surgery

## 2023-06-19 ENCOUNTER — Encounter: Payer: Self-pay | Admitting: Orthopedic Surgery

## 2023-06-19 DIAGNOSIS — G8929 Other chronic pain: Secondary | ICD-10-CM | POA: Diagnosis not present

## 2023-06-19 DIAGNOSIS — Z6841 Body Mass Index (BMI) 40.0 and over, adult: Secondary | ICD-10-CM

## 2023-06-19 DIAGNOSIS — M25562 Pain in left knee: Secondary | ICD-10-CM

## 2023-06-19 DIAGNOSIS — M25561 Pain in right knee: Secondary | ICD-10-CM | POA: Diagnosis not present

## 2023-06-19 MED ORDER — METHYLPREDNISOLONE ACETATE 40 MG/ML IJ SUSP
40.0000 mg | Freq: Once | INTRAMUSCULAR | Status: AC
Start: 2023-06-19 — End: 2023-06-19
  Administered 2023-06-19: 40 mg via INTRA_ARTICULAR

## 2023-06-19 NOTE — Progress Notes (Signed)
Chief Complaint  Patient presents with   Injections    Both knees   The patient has requested injections in both knees for chronic left knee pain from osteoarthritis BMI is over 50 patient not a surgical candidate  Encounter Diagnoses  Name Primary?   Bilateral chronic knee pain Yes   Body mass index 60.0-69.9, adult (HCC)    Morbid obesity (HCC)     Procedure note for bilateral knee injections  Procedure note left knee injection verbal consent was obtained to inject left knee joint  Timeout was completed to confirm the site of injection  The medications used were 40 mg depomedrol and 3 cc of 1% lidocaine  Anesthesia was provided by ethyl chloride and the skin was prepped with alcohol.  After cleaning the skin with alcohol a 20-gauge needle was used to inject the left knee joint. There were no complications. A sterile bandage was applied.   Procedure note right knee injection verbal consent was obtained to inject right knee joint  Timeout was completed to confirm the site of injection  The medications used were 40 mg depomedrol and 3 cc of 1% lidocaine  Anesthesia was provided by ethyl chloride and the skin was prepped with alcohol.  After cleaning the skin with alcohol a 20-gauge needle was used to inject the right knee joint. There were no complications. A sterile bandage was applied.

## 2023-06-19 NOTE — Patient Instructions (Signed)
Give  a note for patient needs to be In first floor apartment, permanent

## 2023-07-03 ENCOUNTER — Ambulatory Visit (INDEPENDENT_AMBULATORY_CARE_PROVIDER_SITE_OTHER): Payer: 59 | Admitting: Orthopedic Surgery

## 2023-07-03 DIAGNOSIS — M25511 Pain in right shoulder: Secondary | ICD-10-CM | POA: Diagnosis not present

## 2023-07-03 DIAGNOSIS — G8929 Other chronic pain: Secondary | ICD-10-CM

## 2023-07-03 DIAGNOSIS — M25512 Pain in left shoulder: Secondary | ICD-10-CM

## 2023-07-03 MED ORDER — METHYLPREDNISOLONE ACETATE 40 MG/ML IJ SUSP
40.0000 mg | Freq: Once | INTRAMUSCULAR | Status: AC
Start: 2023-07-03 — End: 2023-07-03
  Administered 2023-07-03: 40 mg via INTRA_ARTICULAR

## 2023-07-03 NOTE — Progress Notes (Signed)
Chief Complaint  Patient presents with   Shoulder Pain    Both shoulders are hurting.    Encounter Diagnosis  Name Primary?   Chronic pain of both shoulders Yes    Procedure injection right shoulder subacromial joint and left shoulder subacromial joint   procedure note the subacromial injection shoulder RIGHT  Verbal consent was obtained to inject the  RIGHT   Shoulder  Timeout was completed to confirm the injection site is a subacromial space of the  RIGHT  shoulder   Medication used Depo-Medrol 40 mg and lidocaine 1% 3 cc  Anesthesia was provided by ethyl chloride  The injection was performed in the RIGHT  posterior subacromial space. After pinning the skin with alcohol and anesthetized the skin with ethyl chloride the subacromial space was injected using a 20-gauge needle. There were no complications  Sterile dressing was applied.    Procedure note the subacromial injection shoulder left   Verbal consent was obtained to inject the  Left   Shoulder  Timeout was completed to confirm the injection site is a subacromial space of the  left  shoulder  Medication used Depo-Medrol 40 mg and lidocaine 1% 3 cc  Anesthesia was provided by ethyl chloride  The injection was performed in the left  posterior subacromial space. After pinning the skin with alcohol and anesthetized the skin with ethyl chloride the subacromial space was injected using a 20-gauge needle. There were no complications  Sterile dressing was applied.

## 2023-07-17 ENCOUNTER — Ambulatory Visit (INDEPENDENT_AMBULATORY_CARE_PROVIDER_SITE_OTHER): Payer: 59 | Admitting: Orthopedic Surgery

## 2023-07-17 ENCOUNTER — Other Ambulatory Visit: Payer: Self-pay | Admitting: Orthopedic Surgery

## 2023-07-17 DIAGNOSIS — M25562 Pain in left knee: Secondary | ICD-10-CM

## 2023-07-17 DIAGNOSIS — M25561 Pain in right knee: Secondary | ICD-10-CM | POA: Diagnosis not present

## 2023-07-17 DIAGNOSIS — M1712 Unilateral primary osteoarthritis, left knee: Secondary | ICD-10-CM

## 2023-07-17 DIAGNOSIS — G8929 Other chronic pain: Secondary | ICD-10-CM

## 2023-07-17 DIAGNOSIS — M1711 Unilateral primary osteoarthritis, right knee: Secondary | ICD-10-CM

## 2023-07-17 MED ORDER — METHYLPREDNISOLONE ACETATE 40 MG/ML IJ SUSP
40.0000 mg | Freq: Once | INTRAMUSCULAR | Status: AC
Start: 2023-07-17 — End: 2023-07-17
  Administered 2023-07-17: 40 mg via INTRA_ARTICULAR

## 2023-07-17 MED ORDER — METHYLPREDNISOLONE ACETATE 40 MG/ML IJ SUSP
40.0000 mg | Freq: Once | INTRAMUSCULAR | Status: AC
  Administered 2023-07-17: 40 mg via INTRA_ARTICULAR

## 2023-07-17 NOTE — Progress Notes (Signed)
Chief Complaint  Patient presents with   Injections    Both knees   Encounter Diagnosis  Name Primary?   Bilateral chronic knee pain Yes    Procedure note for bilateral knee injections  Procedure note left knee injection verbal consent was obtained to inject left knee joint  Timeout was completed to confirm the site of injection  The medications used were 40 mg depomedrol and 3 cc of 1% lidocaine  Anesthesia was provided by ethyl chloride and the skin was prepped with alcohol.  After cleaning the skin with alcohol a 20-gauge needle was used to inject the left knee joint. There were no complications. A sterile bandage was applied.   Procedure note right knee injection verbal consent was obtained to inject right knee joint  Timeout was completed to confirm the site of injection  The medications used were 40 mg depomedrol and 3 cc of 1% lidocaine  Anesthesia was provided by ethyl chloride and the skin was prepped with alcohol.  After cleaning the skin with alcohol a 20-gauge needle was used to inject the right knee joint. There were no complications. A sterile bandage was applied.

## 2023-08-14 ENCOUNTER — Ambulatory Visit: Payer: 59 | Admitting: Orthopedic Surgery

## 2023-12-03 ENCOUNTER — Ambulatory Visit: Admitting: Orthopedic Surgery

## 2023-12-03 DIAGNOSIS — M25561 Pain in right knee: Secondary | ICD-10-CM | POA: Diagnosis not present

## 2023-12-03 DIAGNOSIS — G8929 Other chronic pain: Secondary | ICD-10-CM | POA: Diagnosis not present

## 2023-12-03 DIAGNOSIS — M25562 Pain in left knee: Secondary | ICD-10-CM

## 2023-12-03 MED ORDER — METHYLPREDNISOLONE ACETATE 40 MG/ML IJ SUSP
40.0000 mg | Freq: Once | INTRAMUSCULAR | Status: AC
  Administered 2023-12-03: 40 mg via INTRA_ARTICULAR

## 2023-12-03 NOTE — Progress Notes (Signed)
 Chief Complaint  Patient presents with   Injections    Bilat knees    Medication Refill    Indocin   Encounter Diagnosis  Name Primary?   Bilateral chronic knee pain Yes    History 58 year old female recently had bypass surgery for weight loss.  Surgery complicated by recognition of large hernia which required further extensive surgery.  Patient recovered.  Comes in for bilateral knee injections for bilateral knee pain  Note if she loses the appropriate amount of weight with her hide of 4 9 I think will need a plastic surgery consult because of the skin issues around this knee  Injection both knees  Procedure note for bilateral knee injections  Procedure note left knee injection verbal consent was obtained to inject left knee joint  Timeout was completed to confirm the site of injection  The medications used were 40 mg depomedrol and 3 cc of 1% lidocaine  Anesthesia was provided by ethyl chloride and the skin was prepped with alcohol.  After cleaning the skin with alcohol a 20-gauge needle was used to inject the left knee joint. There were no complications. A sterile bandage was applied.   Procedure note right knee injection verbal consent was obtained to inject right knee joint  Timeout was completed to confirm the site of injection  The medications used were 40 mg depomedrol and 3 cc of 1% lidocaine  Anesthesia was provided by ethyl chloride and the skin was prepped with alcohol.  After cleaning the skin with alcohol a 20-gauge needle was used to inject the right knee joint. There were no complications. A sterile bandage was applied.

## 2023-12-17 ENCOUNTER — Encounter: Payer: Self-pay | Admitting: Orthopedic Surgery

## 2023-12-17 ENCOUNTER — Ambulatory Visit: Admitting: Orthopedic Surgery

## 2023-12-17 DIAGNOSIS — M25562 Pain in left knee: Secondary | ICD-10-CM | POA: Diagnosis not present

## 2023-12-17 DIAGNOSIS — M25561 Pain in right knee: Secondary | ICD-10-CM

## 2023-12-17 DIAGNOSIS — G8929 Other chronic pain: Secondary | ICD-10-CM

## 2023-12-17 MED ORDER — METHYLPREDNISOLONE ACETATE 40 MG/ML IJ SUSP
40.0000 mg | Freq: Once | INTRAMUSCULAR | Status: AC
  Administered 2023-12-17: 40 mg via INTRA_ARTICULAR

## 2023-12-17 NOTE — Progress Notes (Signed)
 Chief Complaint  Patient presents with   Injections    BOTH SHOULDERS     Encounter Diagnosis  Name Primary?   Bilateral chronic knee pain Yes    Procedure note for bilateral knee injections  Procedure note left knee injection verbal consent was obtained to inject left knee joint  Timeout was completed to confirm the site of injection  The medications used were 40 mg depomedrol and 3 cc of 1% lidocaine  Anesthesia was provided by ethyl chloride and the skin was prepped with alcohol.  After cleaning the skin with alcohol a 20-gauge needle was used to inject the left knee joint. There were no complications. A sterile bandage was applied.   Procedure note right knee injection verbal consent was obtained to inject right knee joint  Timeout was completed to confirm the site of injection  The medications used were 40 mg depomedrol and 3 cc of 1% lidocaine  Anesthesia was provided by ethyl chloride and the skin was prepped with alcohol.  After cleaning the skin with alcohol a 20-gauge needle was used to inject the right knee joint. There were no complications. A sterile bandage was applied.

## 2023-12-31 ENCOUNTER — Ambulatory Visit: Admitting: Orthopedic Surgery

## 2023-12-31 ENCOUNTER — Encounter: Payer: Self-pay | Admitting: Orthopedic Surgery

## 2023-12-31 DIAGNOSIS — M25561 Pain in right knee: Secondary | ICD-10-CM

## 2023-12-31 DIAGNOSIS — M25562 Pain in left knee: Secondary | ICD-10-CM | POA: Diagnosis not present

## 2023-12-31 DIAGNOSIS — G8929 Other chronic pain: Secondary | ICD-10-CM

## 2023-12-31 MED ORDER — METHYLPREDNISOLONE ACETATE 40 MG/ML IJ SUSP
40.0000 mg | Freq: Once | INTRAMUSCULAR | Status: AC
  Administered 2023-12-31: 40 mg via INTRA_ARTICULAR

## 2023-12-31 NOTE — Progress Notes (Signed)
 Chief Complaint  Patient presents with   Injections    Both knees   Encounter Diagnosis  Name Primary?   Bilateral chronic knee pain Yes    Hand is doing well with her weight loss we are anticipating a time where she will lose enough weight to have her knees done  At that time we will need plastic surgery consult to deal with the pannus in the area of the knee incision  Inject both knees  Procedure note for bilateral knee injections  Procedure note left knee injection verbal consent was obtained to inject left knee joint  Timeout was completed to confirm the site of injection  The medications used were 40 mg depomedrol and 3 cc of 1% lidocaine   Anesthesia was provided by ethyl chloride and the skin was prepped with alcohol.  After cleaning the skin with alcohol a 20-gauge needle was used to inject the left knee joint. There were no complications. A sterile bandage was applied.   Procedure note right knee injection verbal consent was obtained to inject right knee joint  Timeout was completed to confirm the site of injection  The medications used were 40 mg depomedrol and 3 cc of 1% lidocaine   Anesthesia was provided by ethyl chloride and the skin was prepped with alcohol.  After cleaning the skin with alcohol a 20-gauge needle was used to inject the right knee joint. There were no complications. A sterile bandage was applied.

## 2024-01-07 ENCOUNTER — Other Ambulatory Visit: Payer: Self-pay | Admitting: Orthopedic Surgery

## 2024-01-07 DIAGNOSIS — M1712 Unilateral primary osteoarthritis, left knee: Secondary | ICD-10-CM

## 2024-01-07 DIAGNOSIS — M25562 Pain in left knee: Secondary | ICD-10-CM

## 2024-01-07 DIAGNOSIS — M1711 Unilateral primary osteoarthritis, right knee: Secondary | ICD-10-CM

## 2024-01-07 MED ORDER — INDOMETHACIN 25 MG PO CAPS
25.0000 mg | ORAL_CAPSULE | Freq: Two times a day (BID) | ORAL | 0 refills | Status: DC
Start: 2024-01-07 — End: 2024-02-29

## 2024-01-14 ENCOUNTER — Ambulatory Visit: Admitting: Orthopedic Surgery

## 2024-01-14 ENCOUNTER — Encounter: Payer: Self-pay | Admitting: Orthopedic Surgery

## 2024-01-14 VITALS — Wt 305.0 lb

## 2024-01-14 DIAGNOSIS — M1712 Unilateral primary osteoarthritis, left knee: Secondary | ICD-10-CM

## 2024-01-14 DIAGNOSIS — M170 Bilateral primary osteoarthritis of knee: Secondary | ICD-10-CM

## 2024-01-14 DIAGNOSIS — M1711 Unilateral primary osteoarthritis, right knee: Secondary | ICD-10-CM

## 2024-01-14 DIAGNOSIS — G8929 Other chronic pain: Secondary | ICD-10-CM

## 2024-01-14 MED ORDER — METHYLPREDNISOLONE ACETATE 40 MG/ML IJ SUSP
40.0000 mg | Freq: Once | INTRAMUSCULAR | Status: AC
  Administered 2024-05-25: 40 mg via INTRA_ARTICULAR

## 2024-01-14 NOTE — Progress Notes (Signed)
 Chief Complaint  Patient presents with   Injections    Wants both knees, injected 2 weeks ago but painful shoulders ok    Encounter Diagnoses  Name Primary?   Bilateral chronic knee pain Yes   Primary osteoarthritis of right knee    Primary osteoarthritis of left knee      Gwendolyn Gutierrez comes in for her routine alternating shots in her shoulders.  However she has been walking more and would like injections in her knees but just got that 2 weeks ago  Recommend not injecting the knees at this time  Recommend 500 mg of Tylenol  every 6 hours in addition to the Indocin  that she is already taking  Recommend not increasing the Indocin  since she had stomach surgery

## 2024-01-28 ENCOUNTER — Ambulatory Visit (INDEPENDENT_AMBULATORY_CARE_PROVIDER_SITE_OTHER): Admitting: Orthopedic Surgery

## 2024-01-28 ENCOUNTER — Encounter: Payer: Self-pay | Admitting: Orthopedic Surgery

## 2024-01-28 DIAGNOSIS — M1711 Unilateral primary osteoarthritis, right knee: Secondary | ICD-10-CM

## 2024-01-28 DIAGNOSIS — M25562 Pain in left knee: Secondary | ICD-10-CM

## 2024-01-28 DIAGNOSIS — M1712 Unilateral primary osteoarthritis, left knee: Secondary | ICD-10-CM

## 2024-01-28 MED ORDER — METHYLPREDNISOLONE ACETATE 40 MG/ML IJ SUSP
40.0000 mg | Freq: Once | INTRAMUSCULAR | Status: AC
  Administered 2024-01-28: 40 mg via INTRA_ARTICULAR

## 2024-01-28 NOTE — Progress Notes (Signed)
 Chief Complaint  Patient presents with   Injections    Both knees   Encounter Diagnoses  Name Primary?   Bilateral chronic knee pain Yes   Primary osteoarthritis of right knee    Primary osteoarthritis of left knee     Procedure note for bilateral knee injections  Procedure note left knee injection verbal consent was obtained to inject left knee joint  Timeout was completed to confirm the site of injection  The medications used were 40 mg depomedrol and 3 cc of 1% lidocaine   Anesthesia was provided by ethyl chloride and the skin was prepped with alcohol.  After cleaning the skin with alcohol a 20-gauge needle was used to inject the left knee joint. There were no complications. A sterile bandage was applied.   Procedure note right knee injection verbal consent was obtained to inject right knee joint  Timeout was completed to confirm the site of injection  The medications used were 40 mg depomedrol and 3 cc of 1% lidocaine   Anesthesia was provided by ethyl chloride and the skin was prepped with alcohol.  After cleaning the skin with alcohol a 20-gauge needle was used to inject the right knee joint. There were no complications. A sterile bandage was applied.

## 2024-02-14 ENCOUNTER — Emergency Department (HOSPITAL_COMMUNITY)

## 2024-02-14 ENCOUNTER — Emergency Department (HOSPITAL_COMMUNITY)
Admission: EM | Admit: 2024-02-14 | Discharge: 2024-02-14 | Disposition: A | Discharge status: Home / Self Care | Attending: Emergency Medicine | Admitting: Emergency Medicine

## 2024-02-14 ENCOUNTER — Other Ambulatory Visit: Payer: Self-pay

## 2024-02-14 ENCOUNTER — Encounter (HOSPITAL_COMMUNITY): Payer: Self-pay

## 2024-02-14 DIAGNOSIS — J45909 Unspecified asthma, uncomplicated: Secondary | ICD-10-CM | POA: Diagnosis not present

## 2024-02-14 DIAGNOSIS — J209 Acute bronchitis, unspecified: Secondary | ICD-10-CM

## 2024-02-14 DIAGNOSIS — I10 Essential (primary) hypertension: Secondary | ICD-10-CM | POA: Insufficient documentation

## 2024-02-14 DIAGNOSIS — Z79899 Other long term (current) drug therapy: Secondary | ICD-10-CM | POA: Diagnosis not present

## 2024-02-14 DIAGNOSIS — R059 Cough, unspecified: Secondary | ICD-10-CM | POA: Diagnosis present

## 2024-02-14 LAB — RESP PANEL BY RT-PCR (RSV, FLU A&B, COVID)  RVPGX2
Influenza A by PCR: NEGATIVE
Influenza B by PCR: NEGATIVE
Resp Syncytial Virus by PCR: NEGATIVE
SARS Coronavirus 2 by RT PCR: NEGATIVE

## 2024-02-14 MED ORDER — BENZONATATE 100 MG PO CAPS
200.0000 mg | ORAL_CAPSULE | Freq: Once | ORAL | Status: AC
  Administered 2024-02-14: 200 mg via ORAL
  Filled 2024-02-14: qty 2

## 2024-02-14 MED ORDER — BENZONATATE 100 MG PO CAPS: 200.0000 mg | ORAL_CAPSULE | Freq: Three times a day (TID) | ORAL | 0 refills | Status: AC | PRN

## 2024-02-14 MED ORDER — AEROCHAMBER Z-STAT PLUS/MEDIUM MISC: 1.0000 | Freq: Once | Status: DC

## 2024-02-14 MED ORDER — ALBUTEROL SULFATE HFA 108 (90 BASE) MCG/ACT IN AERS
2.0000 | INHALATION_SPRAY | Freq: Once | RESPIRATORY_TRACT | Status: AC
  Administered 2024-02-14: 2 via RESPIRATORY_TRACT
  Filled 2024-02-14: qty 6.7

## 2024-02-14 NOTE — Discharge Instructions (Signed)
 Continue taking the tessalon to help with your cough. You may use your inhaler, taking 2 puffs every 4 hours if you are wheezing or short of breath.  Plan to followup with your primary MD if your symptoms do not resolve this week.

## 2024-02-14 NOTE — ED Notes (Signed)
 While pt ambulated she was slightly winded but o2 saturation stayed at 96% on room air.

## 2024-02-14 NOTE — ED Provider Notes (Signed)
 [place] [facility] AT [facility] Provider Note   CSN: [id number] Arrival date & time: 02/14/24  1956     History  Chief Complaint  Patient presents with   Cough    Gwendolyn Gutierrez is a 58 y.o. female with a history including hypertension, arthritis, gout, who is 6 months post gastric bypass surgery for evaluation of cough, congestion, chills and generalized fatigue which started approximately 4 days ago.  Her cough has been nonproductive although states she feels she needs to cough up phlegm.  She has had some intermittent wheezing as well with exertion but mostly notices it at nighttime.  She denies fevers, upper respiratory symptoms, sore throat.  She has an acquaintance that tested positive for COVID last week.  Patient has a distant history of asthma, stating it has been more than 20 years since she has been on medication for this.  She denies chest pain, nausea vomiting, abdominal pain.  She denies increased swelling or pain in her legs.  The history is provided by the patient.       Home Medications Prior to Admission medications   Medication Sig Start Date End Date Taking? Authorizing Provider  benzonatate (TESSALON) 100 MG capsule Take 2 capsules (200 mg total) by mouth 3 (three) times daily as needed. 02/14/24  Yes Nawaal Alling, PA-C  allopurinol (ZYLOPRIM) 300 MG tablet Take 1 tablet by mouth daily.    [provider]  amLODipine (NORVASC) 5 MG tablet Take 5 mg by mouth daily. 03/24/22   [provider]  buPROPion (WELLBUTRIN XL) 150 MG 24 hr tablet Take 1 tablet by mouth daily. 09/25/22 12/24/22  [provider]  DULoxetine (CYMBALTA) 30 MG capsule 1 capsule    [provider]  famotidine (PEPCID) 40 MG tablet 1 tablet Orally Once a day for 30 days 01/08/24   [provider]  gabapentin (NEURONTIN) 600 MG tablet 1 tablet Orally Three times a day for 90 days moderate pain 03/17/23   [provider]   indomethacin  (INDOCIN ) 25 MG capsule Take 1 capsule (25 mg total) by mouth 2 (two) times daily with a meal. 01/07/24   Darrin Emerald, MD  metoprolol succinate (TOPROL-XL) 50 MG 24 hr tablet Take 50 mg by mouth daily. 01/08/22   [provider]  omeprazole (PRILOSEC) 20 MG capsule TAKE 1 CAPSULE BY MOUTH IN THE MORNING 30 MINUTES BEFORE MORNING MEAL.    [provider]      Allergies    Patient has no known allergies.    Review of Systems   Review of Systems  Constitutional:  Negative for chills and fever.  HENT:  Negative for congestion and sore throat.   Eyes: Negative.   Respiratory:  Positive for cough, shortness of breath and wheezing. Negative for chest tightness.   Cardiovascular:  Negative for chest pain.  Gastrointestinal:  Negative for abdominal pain and nausea.  Genitourinary: Negative.   Musculoskeletal:  Negative for arthralgias, joint swelling and neck pain.  Skin: Negative.  Negative for rash and wound.  Neurological:  Negative for dizziness, weakness, light-headedness, numbness and headaches.  Psychiatric/Behavioral: Negative.      Physical Exam Updated Vital Signs BP 135/84   Pulse 81   Temp 97.6 F (36.4 C) (Oral)   Resp 18   Ht 4\' 9"  (1.448 m)   Wt 134.9 kg   SpO2 98%   BMI 64.34 kg/m  Physical Exam Vitals and nursing note reviewed.  Constitutional:  Appearance: She is well-developed.  HENT:     Head: Normocephalic and atraumatic.  Eyes:     Conjunctiva/sclera: Conjunctivae normal.  Cardiovascular:     Rate and Rhythm: Normal rate and regular rhythm.     Heart sounds: Normal heart sounds.  Pulmonary:     Effort: Pulmonary effort is normal.     Breath sounds: Normal breath sounds. No wheezing or rhonchi.  Abdominal:     General: Bowel sounds are normal.     Palpations: Abdomen is soft.     Tenderness: There is no abdominal tenderness.  Musculoskeletal:        General: Normal range of motion.     Cervical back: Normal  range of motion.  Skin:    General: Skin is warm and dry.  Neurological:     Mental Status: She is alert and oriented to person, place, and time.     ED Results / Procedures / Treatments   Labs (all labs ordered are listed, but only abnormal results are displayed) Labs Reviewed  RESP PANEL BY RT-PCR (RSV, FLU A&B, COVID)  [id number]    EKG None  Radiology DG Chest 2 View Result Date: 02/14/2024 CLINICAL DATA:  Cough and shortness of breath EXAM: CHEST - 2 VIEW COMPARISON:  CT chest 05/02/2021 FINDINGS: The heart size and mediastinal contours are within normal limits. Both lungs are clear. The visualized skeletal structures are unremarkable. IMPRESSION: No active cardiopulmonary disease. Electronically Signed   By: Rozell Cornet M.D.   On: 02/14/2024 22:03    Procedures Procedures    Medications Ordered in ED Medications  aerochamber Z-Stat Plus/medium 1 each (has no administration in time range)  benzonatate (TESSALON) capsule 200 mg (200 mg Oral Given 02/14/24 2150)  albuterol (VENTOLIN HFA) 108 (90 Base) MCG/ACT inhaler 2 puff (2 puffs Inhalation Given 02/14/24 2341)    ED Course/ Medical Decision Making/ A&P                                 Medical Decision Making Pt with 4-day history of nonproductive cough, congestion, chills and intermittent wheezing, predominantly at night, however she is not wheezing on today's exam.  No chest pain, but does endorse anterior chest pain with coughing only.  Suspect patient may have a bronchitis, chest x-ray obtained to rule out pneumonia.  She was given Tessalon pearls while awaiting lab and imaging results which did improve her cough.  She was also given albuterol MDI for as needed use for any return of her wheezing.  She was encouraged to follow-up with her primary provider if her symptoms are not improving with this treatment plan during the course of this week.  Amount and/or Complexity of Data Reviewed Labs: ordered.    Details:  Respiratory panel is negative Radiology: ordered.    Details: Chest x-ray clear, no pneumonia.  Risk Prescription drug management.           Final Clinical Impression(s) / ED Diagnoses Final diagnoses:  Acute bronchitis, unspecified organism    Rx / DC Orders ED Discharge Orders          Ordered    benzonatate (TESSALON) 100 MG capsule  3 times daily PRN        02/14/24 2350              Tykeem Lanzer, PA-C 02/14/24 2356    Early Glisson, MD 02/15/24 1253

## 2024-02-14 NOTE — ED Triage Notes (Signed)
 Pt to ED from home with c/o cough, congestion, chills, and fatigue x 4 days. Pt says she wants her electrolytes checked. Pt reports making urine as normal.

## 2024-02-15 ENCOUNTER — Encounter: Payer: Self-pay | Admitting: Orthopedic Surgery

## 2024-02-15 ENCOUNTER — Ambulatory Visit (INDEPENDENT_AMBULATORY_CARE_PROVIDER_SITE_OTHER): Admitting: Orthopedic Surgery

## 2024-02-15 DIAGNOSIS — M25511 Pain in right shoulder: Secondary | ICD-10-CM | POA: Diagnosis not present

## 2024-02-15 DIAGNOSIS — G8929 Other chronic pain: Secondary | ICD-10-CM | POA: Diagnosis not present

## 2024-02-15 DIAGNOSIS — M25512 Pain in left shoulder: Secondary | ICD-10-CM | POA: Diagnosis not present

## 2024-02-15 MED ORDER — METHYLPREDNISOLONE ACETATE 40 MG/ML IJ SUSP
40.0000 mg | Freq: Once | INTRAMUSCULAR | Status: AC
  Administered 2024-02-15: 40 mg via INTRA_ARTICULAR

## 2024-02-15 NOTE — Progress Notes (Signed)
 Chief Complaint  Patient presents with   Injections    Both shoulders     Encounter Diagnosis  Name Primary?   Chronic pain of both shoulders Yes    Procedure injection right shoulder subacromial joint and left shoulder subacromial joint   procedure note the subacromial injection shoulder RIGHT  Verbal consent was obtained to inject the  RIGHT   Shoulder  Timeout was completed to confirm the injection site is a subacromial space of the  RIGHT  shoulder   Medication used Depo-Medrol  40 mg and lidocaine  1% 3 cc  Anesthesia was provided by ethyl chloride  The injection was performed in the RIGHT  posterior subacromial space. After pinning the skin with alcohol and anesthetized the skin with ethyl chloride the subacromial space was injected using a 20-gauge needle. There were no complications  Sterile dressing was applied.    Procedure note the subacromial injection shoulder left   Verbal consent was obtained to inject the  Left   Shoulder  Timeout was completed to confirm the injection site is a subacromial space of the  left  shoulder  Medication used Depo-Medrol  40 mg and lidocaine  1% 3 cc  Anesthesia was provided by ethyl chloride  The injection was performed in the left  posterior subacromial space. After pinning the skin with alcohol and anesthetized the skin with ethyl chloride the subacromial space was injected using a 20-gauge needle. There were no complications  Sterile dressing was applied.

## 2024-02-26 ENCOUNTER — Other Ambulatory Visit: Payer: Self-pay | Admitting: Orthopedic Surgery

## 2024-02-26 DIAGNOSIS — M1712 Unilateral primary osteoarthritis, left knee: Secondary | ICD-10-CM

## 2024-02-26 DIAGNOSIS — M1711 Unilateral primary osteoarthritis, right knee: Secondary | ICD-10-CM

## 2024-02-26 DIAGNOSIS — M25562 Pain in left knee: Secondary | ICD-10-CM

## 2024-03-03 ENCOUNTER — Ambulatory Visit: Admitting: Orthopedic Surgery

## 2024-03-04 ENCOUNTER — Encounter: Payer: Self-pay | Admitting: Orthopedic Surgery

## 2024-03-04 ENCOUNTER — Ambulatory Visit (INDEPENDENT_AMBULATORY_CARE_PROVIDER_SITE_OTHER): Admitting: Orthopedic Surgery

## 2024-03-04 VITALS — Ht 59.0 in | Wt 290.0 lb

## 2024-03-04 DIAGNOSIS — M1711 Unilateral primary osteoarthritis, right knee: Secondary | ICD-10-CM

## 2024-03-04 DIAGNOSIS — M1712 Unilateral primary osteoarthritis, left knee: Secondary | ICD-10-CM

## 2024-03-04 DIAGNOSIS — M170 Bilateral primary osteoarthritis of knee: Secondary | ICD-10-CM | POA: Diagnosis not present

## 2024-03-04 MED ORDER — METHYLPREDNISOLONE ACETATE 40 MG/ML IJ SUSP
40.0000 mg | Freq: Once | INTRAMUSCULAR | Status: AC
  Administered 2024-03-04: 40 mg via INTRA_ARTICULAR

## 2024-03-04 NOTE — Progress Notes (Signed)
   There were no vitals taken for this visit.  There is no height or weight on file to calculate BMI.  Chief Complaint  Patient presents with   Injections    BOTH KNEES     No diagnosis found.  DOI/DOS/ Date:    Worse LEFT WORSE THAN RIGHT

## 2024-03-04 NOTE — Progress Notes (Signed)
   Ht 4' 11 (1.499 m)   Wt 290 lb (131.5 kg)   BMI 58.57 kg/m   Chief Complaint  Patient presents with   Injections    BOTH KNEES     Encounter Diagnoses  Name Primary?   Primary osteoarthritis of right knee Yes   Primary osteoarthritis of left knee     DOI/DOS/ Date:    Worse LEFT WORSE THAN RIGHT   Today we are performing bilateral knee injections;  Procedure note for bilateral knee injections  Procedure note left knee injection verbal consent was obtained to inject left knee joint  Timeout was completed to confirm the site of injection  The medications used were 40 mg depomedrol and 3 cc of 1% lidocaine   Anesthesia was provided by ethyl chloride and the skin was prepped with alcohol.  After cleaning the skin with alcohol a 20-gauge needle was used to inject the left knee joint. There were no complications. A sterile bandage was applied.   Procedure note right knee injection verbal consent was obtained to inject right knee joint  Timeout was completed to confirm the site of injection  The medications used were 40 mg depomedrol and 3 cc of 1% lidocaine   Anesthesia was provided by ethyl chloride and the skin was prepped with alcohol.  After cleaning the skin with alcohol a 20-gauge needle was used to inject the right knee joint. There were no complications. A sterile bandage was applied.

## 2024-03-18 ENCOUNTER — Ambulatory Visit (INDEPENDENT_AMBULATORY_CARE_PROVIDER_SITE_OTHER): Admitting: Orthopedic Surgery

## 2024-03-18 DIAGNOSIS — G8929 Other chronic pain: Secondary | ICD-10-CM

## 2024-03-18 DIAGNOSIS — M25512 Pain in left shoulder: Secondary | ICD-10-CM | POA: Diagnosis not present

## 2024-03-18 DIAGNOSIS — M25511 Pain in right shoulder: Secondary | ICD-10-CM

## 2024-03-18 MED ORDER — METHYLPREDNISOLONE ACETATE 40 MG/ML IJ SUSP
40.0000 mg | Freq: Once | INTRAMUSCULAR | Status: AC
  Administered 2024-03-18: 40 mg via INTRA_ARTICULAR

## 2024-03-18 NOTE — Progress Notes (Signed)
 Chief Complaint  Patient presents with   Injections    B shoulders   Encounter Diagnosis  Name Primary?   Chronic pain of both shoulders Yes    Procedure injection right shoulder subacromial joint and left shoulder subacromial joint   procedure note the subacromial injection shoulder RIGHT  Verbal consent was obtained to inject the  RIGHT   Shoulder  Timeout was completed to confirm the injection site is a subacromial space of the  RIGHT  shoulder   Medication used Depo-Medrol  40 mg and lidocaine  1% 3 cc  Anesthesia was provided by ethyl chloride  The injection was performed in the RIGHT  posterior subacromial space. After pinning the skin with alcohol and anesthetized the skin with ethyl chloride the subacromial space was injected using a 20-gauge needle. There were no complications  Sterile dressing was applied.    Procedure note the subacromial injection shoulder left   Verbal consent was obtained to inject the  Left   Shoulder  Timeout was completed to confirm the injection site is a subacromial space of the  left  shoulder  Medication used Depo-Medrol  40 mg and lidocaine  1% 3 cc  Anesthesia was provided by ethyl chloride  The injection was performed in the left  posterior subacromial space. After pinning the skin with alcohol and anesthetized the skin with ethyl chloride the subacromial space was injected using a 20-gauge needle. There were no complications  Sterile dressing was applied.   Follow-up in 2 weeks for knee injection, both knees

## 2024-04-01 ENCOUNTER — Ambulatory Visit: Admitting: Orthopedic Surgery

## 2024-04-01 DIAGNOSIS — M1712 Unilateral primary osteoarthritis, left knee: Secondary | ICD-10-CM

## 2024-04-01 DIAGNOSIS — M1711 Unilateral primary osteoarthritis, right knee: Secondary | ICD-10-CM

## 2024-04-01 DIAGNOSIS — M170 Bilateral primary osteoarthritis of knee: Secondary | ICD-10-CM

## 2024-04-01 MED ORDER — METHYLPREDNISOLONE ACETATE 40 MG/ML IJ SUSP
40.0000 mg | Freq: Once | INTRAMUSCULAR | Status: AC
  Administered 2024-04-01: 40 mg via INTRA_ARTICULAR

## 2024-04-01 NOTE — Progress Notes (Signed)
 Encounter Diagnoses  Name Primary?   Primary osteoarthritis of right knee Yes   Primary osteoarthritis of left knee    Chief Complaint  Patient presents with   Injections    Bilateral knees   Gwendolyn Gutierrez comes in for her routine monthly bilateral knee injections.  She has osteoarthritis of both knees she has had weight loss reduction surgery which is going well.  She has not quite reached the weight amenable to knee replacement  She would like to injections 1 in each knee  Procedure note for bilateral knee injections  Procedure note left knee injection verbal consent was obtained to inject left knee joint  Timeout was completed to confirm the site of injection  The medications used were 40 mg depomedrol and 3 cc of 1% lidocaine   Anesthesia was provided by ethyl chloride and the skin was prepped with alcohol.  After cleaning the skin with alcohol a 20-gauge needle was used to inject the left knee joint. There were no complications. A sterile bandage was applied.   Procedure note right knee injection verbal consent was obtained to inject right knee joint  Timeout was completed to confirm the site of injection  The medications used were 40 mg depomedrol and 3 cc of 1% lidocaine   Anesthesia was provided by ethyl chloride and the skin was prepped with alcohol.  After cleaning the skin with alcohol a 20-gauge needle was used to inject the right knee joint. There were no complications. A sterile bandage was applied.    Follow-up in 2 weeks for right and left shoulder injection

## 2024-04-01 NOTE — Addendum Note (Signed)
 Addended by: MARCINE HUSBAND T on: 04/01/2024 09:18 AM   Modules accepted: Orders

## 2024-04-15 ENCOUNTER — Ambulatory Visit: Admitting: Orthopedic Surgery

## 2024-04-15 DIAGNOSIS — M25512 Pain in left shoulder: Secondary | ICD-10-CM | POA: Diagnosis not present

## 2024-04-15 DIAGNOSIS — G8929 Other chronic pain: Secondary | ICD-10-CM

## 2024-04-15 DIAGNOSIS — M25511 Pain in right shoulder: Secondary | ICD-10-CM

## 2024-04-15 MED ORDER — METHYLPREDNISOLONE ACETATE 40 MG/ML IJ SUSP
40.0000 mg | Freq: Once | INTRAMUSCULAR | Status: AC
  Administered 2024-04-15: 40 mg via INTRA_ARTICULAR

## 2024-04-15 NOTE — Progress Notes (Signed)
 Encounter Diagnosis  Name Primary?   Chronic pain of both shoulders Yes   Chief Complaint  Patient presents with   Injections    BOTH shoulders    Procedure injection right shoulder subacromial joint and left shoulder subacromial joint   procedure note the subacromial injection shoulder RIGHT  Verbal consent was obtained to inject the  RIGHT   Shoulder  Timeout was completed to confirm the injection site is a subacromial space of the  RIGHT  shoulder   Medication used Depo-Medrol  40 mg and lidocaine  1% 3 cc  Anesthesia was provided by ethyl chloride  The injection was performed in the RIGHT  posterior subacromial space. After pinning the skin with alcohol and anesthetized the skin with ethyl chloride the subacromial space was injected using a 20-gauge needle. There were no complications  Sterile dressing was applied.    Procedure note the subacromial injection shoulder left   Verbal consent was obtained to inject the  Left   Shoulder  Timeout was completed to confirm the injection site is a subacromial space of the  left  shoulder  Medication used Depo-Medrol  40 mg and lidocaine  1% 3 cc  Anesthesia was provided by ethyl chloride  The injection was performed in the left  posterior subacromial space. After pinning the skin with alcohol and anesthetized the skin with ethyl chloride the subacromial space was injected using a 20-gauge needle. There were no complications  Sterile dressing was applied.

## 2024-05-02 ENCOUNTER — Ambulatory Visit (INDEPENDENT_AMBULATORY_CARE_PROVIDER_SITE_OTHER): Admitting: Orthopedic Surgery

## 2024-05-02 DIAGNOSIS — G8929 Other chronic pain: Secondary | ICD-10-CM

## 2024-05-02 DIAGNOSIS — M1711 Unilateral primary osteoarthritis, right knee: Secondary | ICD-10-CM

## 2024-05-02 DIAGNOSIS — M170 Bilateral primary osteoarthritis of knee: Secondary | ICD-10-CM

## 2024-05-02 DIAGNOSIS — M1712 Unilateral primary osteoarthritis, left knee: Secondary | ICD-10-CM

## 2024-05-02 MED ORDER — METHYLPREDNISOLONE ACETATE 40 MG/ML IJ SUSP
40.0000 mg | Freq: Once | INTRAMUSCULAR | Status: AC
  Administered 2024-05-02: 40 mg via INTRA_ARTICULAR

## 2024-05-02 NOTE — Progress Notes (Unsigned)
   Chief Complaint  Patient presents with   Follow-up    Recheck on chronic bilateral knee pain    Encounter Diagnoses  Name Primary?   Primary osteoarthritis of right knee Yes   Primary osteoarthritis of left knee    Bilateral chronic knee pain     DOI/DOS/ N/A   Patient request bilateral knee injections for chronic knee pain   Procedure note for bilateral knee injections  Procedure note left knee injection verbal consent was obtained to inject left knee joint  Timeout was completed to confirm the site of injection  The medications used were 40 mg depomedrol and 3 cc of 1% lidocaine   Anesthesia was provided by ethyl chloride and the skin was prepped with alcohol.  After cleaning the skin with alcohol a 20-gauge needle was used to inject the left knee joint. There were no complications. A sterile bandage was applied.   Procedure note right knee injection verbal consent was obtained to inject right knee joint  Timeout was completed to confirm the site of injection  The medications used were 40 mg depomedrol and 3 cc of 1% lidocaine   Anesthesia was provided by ethyl chloride and the skin was prepped with alcohol.  After cleaning the skin with alcohol a 20-gauge needle was used to inject the right knee joint. There were no complications. A sterile bandage was applied.

## 2024-05-02 NOTE — Progress Notes (Unsigned)
   There were no vitals taken for this visit.  There is no height or weight on file to calculate BMI.  Chief Complaint  Patient presents with   Follow-up    Recheck on chronic bilateral knee pain    No diagnosis found.  DOI/DOS/ N/A   Unchanged

## 2024-05-19 ENCOUNTER — Ambulatory Visit: Admitting: Orthopedic Surgery

## 2024-05-25 ENCOUNTER — Ambulatory Visit (INDEPENDENT_AMBULATORY_CARE_PROVIDER_SITE_OTHER): Admitting: Orthopedic Surgery

## 2024-05-25 DIAGNOSIS — M25511 Pain in right shoulder: Secondary | ICD-10-CM

## 2024-05-25 DIAGNOSIS — M25512 Pain in left shoulder: Secondary | ICD-10-CM

## 2024-05-25 DIAGNOSIS — G8929 Other chronic pain: Secondary | ICD-10-CM | POA: Diagnosis not present

## 2024-05-25 NOTE — Progress Notes (Signed)
 Chief Complaint  Patient presents with   Injections    Shoulders both     Encounter Diagnoses  Name Primary?   Chronic pain in right shoulder Yes   Chronic left shoulder pain     Procedure injection right shoulder subacromial joint and left shoulder subacromial joint   procedure note the subacromial injection shoulder RIGHT  Verbal consent was obtained to inject the  RIGHT   Shoulder  Timeout was completed to confirm the injection site is a subacromial space of the  RIGHT  shoulder   Medication used Depo-Medrol  40 mg and lidocaine  1% 3 cc  Anesthesia was provided by ethyl chloride  The injection was performed in the RIGHT  posterior subacromial space. After pinning the skin with alcohol and anesthetized the skin with ethyl chloride the subacromial space was injected using a 20-gauge needle. There were no complications  Sterile dressing was applied.    Procedure note the subacromial injection shoulder left   Verbal consent was obtained to inject the  Left   Shoulder  Timeout was completed to confirm the injection site is a subacromial space of the  left  shoulder  Medication used Depo-Medrol  40 mg and lidocaine  1% 3 cc  Anesthesia was provided by ethyl chloride  The injection was performed in the left  posterior subacromial space. After pinning the skin with alcohol and anesthetized the skin with ethyl chloride the subacromial space was injected using a 20-gauge needle. There were no complications  Sterile dressing was applied.

## 2024-06-01 ENCOUNTER — Ambulatory Visit (INDEPENDENT_AMBULATORY_CARE_PROVIDER_SITE_OTHER): Admitting: Orthopedic Surgery

## 2024-06-01 ENCOUNTER — Encounter: Payer: Self-pay | Admitting: Orthopedic Surgery

## 2024-06-01 DIAGNOSIS — M1711 Unilateral primary osteoarthritis, right knee: Secondary | ICD-10-CM

## 2024-06-01 DIAGNOSIS — M1712 Unilateral primary osteoarthritis, left knee: Secondary | ICD-10-CM

## 2024-06-01 MED ORDER — METHYLPREDNISOLONE ACETATE 40 MG/ML IJ SUSP
40.0000 mg | Freq: Once | INTRAMUSCULAR | Status: AC
  Administered 2024-06-01: 40 mg via INTRA_ARTICULAR

## 2024-06-01 NOTE — Progress Notes (Signed)
 Chief Complaint  Patient presents with   Injections    Both knees    Encounter Diagnoses  Name Primary?   Primary osteoarthritis of right knee Yes   Primary osteoarthritis of left knee     Procedure note for bilateral knee injections  Procedure note left knee injection verbal consent was obtained to inject left knee joint  Timeout was completed to confirm the site of injection  The medications used were 40 mg depomedrol and 3 cc of 1% lidocaine   Anesthesia was provided by ethyl chloride and the skin was prepped with alcohol.  After cleaning the skin with alcohol a 20-gauge needle was used to inject the left knee joint. There were no complications. A sterile bandage was applied.   Procedure note right knee injection verbal consent was obtained to inject right knee joint  Timeout was completed to confirm the site of injection  The medications used were 40 mg depomedrol and 3 cc of 1% lidocaine   Anesthesia was provided by ethyl chloride and the skin was prepped with alcohol.  After cleaning the skin with alcohol a 20-gauge needle was used to inject the right knee joint. There were no complications. A sterile bandage was applied.

## 2024-06-01 NOTE — Patient Instructions (Signed)
 They will need you to get a CD of the xray images from [facility]. The number to call for the CD is [phone] ask for [facility]

## 2024-06-01 NOTE — Progress Notes (Signed)
 Gwendolyn Gutierrez

## 2024-06-15 ENCOUNTER — Encounter: Payer: Self-pay | Admitting: Orthopedic Surgery

## 2024-06-15 ENCOUNTER — Ambulatory Visit: Admitting: Orthopedic Surgery

## 2024-06-15 DIAGNOSIS — M25511 Pain in right shoulder: Secondary | ICD-10-CM | POA: Diagnosis not present

## 2024-06-15 DIAGNOSIS — G8929 Other chronic pain: Secondary | ICD-10-CM | POA: Diagnosis not present

## 2024-06-15 DIAGNOSIS — M25512 Pain in left shoulder: Secondary | ICD-10-CM

## 2024-06-15 MED ORDER — METHYLPREDNISOLONE ACETATE 40 MG/ML IJ SUSP
40.0000 mg | Freq: Once | INTRAMUSCULAR | Status: AC
  Administered 2024-06-15: 40 mg via INTRA_ARTICULAR

## 2024-06-15 MED ORDER — METHYLPREDNISOLONE ACETATE 40 MG/ML IJ SUSP
40.0000 mg | Freq: Once | INTRAMUSCULAR | Status: AC
Start: 2024-06-15 — End: 2024-06-15
  Administered 2024-06-15: 40 mg via INTRA_ARTICULAR

## 2024-06-15 NOTE — Addendum Note (Signed)
 Addended byBETHA JENEAN GREIG LELON on: 06/15/2024 10:51 AM   Modules accepted: Orders

## 2024-06-15 NOTE — Progress Notes (Signed)
 Chief Complaint  Patient presents with   Injections    Left and right shoulders    Encounter Diagnosis  Name Primary?   Chronic pain of both shoulders Yes    Procedure injection right shoulder subacromial joint and left shoulder subacromial joint   procedure note the subacromial injection shoulder RIGHT  Verbal consent was obtained to inject the  RIGHT   Shoulder  Timeout was completed to confirm the injection site is a subacromial space of the  RIGHT  shoulder   Medication used Depo-Medrol  40 mg and lidocaine  1% 3 cc  Anesthesia was provided by ethyl chloride  The injection was performed in the RIGHT  posterior subacromial space. After pinning the skin with alcohol and anesthetized the skin with ethyl chloride the subacromial space was injected using a 20-gauge needle. There were no complications  Sterile dressing was applied.    Procedure note the subacromial injection shoulder left   Verbal consent was obtained to inject the  Left   Shoulder  Timeout was completed to confirm the injection site is a subacromial space of the  left  shoulder  Medication used Depo-Medrol  40 mg and lidocaine  1% 3 cc  Anesthesia was provided by ethyl chloride  The injection was performed in the left  posterior subacromial space. After pinning the skin with alcohol and anesthetized the skin with ethyl chloride the subacromial space was injected using a 20-gauge needle. There were no complications  Sterile dressing was applied.

## 2024-06-30 ENCOUNTER — Ambulatory Visit: Admitting: Orthopedic Surgery

## 2024-06-30 ENCOUNTER — Encounter: Payer: Self-pay | Admitting: Orthopedic Surgery

## 2024-06-30 DIAGNOSIS — M1712 Unilateral primary osteoarthritis, left knee: Secondary | ICD-10-CM

## 2024-06-30 DIAGNOSIS — M170 Bilateral primary osteoarthritis of knee: Secondary | ICD-10-CM

## 2024-06-30 DIAGNOSIS — M1711 Unilateral primary osteoarthritis, right knee: Secondary | ICD-10-CM

## 2024-06-30 DIAGNOSIS — G8929 Other chronic pain: Secondary | ICD-10-CM

## 2024-06-30 MED ORDER — METHYLPREDNISOLONE ACETATE 40 MG/ML IJ SUSP
40.0000 mg | Freq: Once | INTRAMUSCULAR | Status: AC
  Administered 2024-06-30: 40 mg via INTRA_ARTICULAR

## 2024-06-30 NOTE — Progress Notes (Signed)
   Procedure note for injection   Chief Complaint  Patient presents with   Injections    Both knees      Encounter Diagnoses  Name Primary?   Primary osteoarthritis of right knee Yes   Primary osteoarthritis of left knee    Bilateral chronic knee pain         The patient has consented for injection of the Joint: knee, RIGHT AND LEFT   Medication: Depo-Medrol  40 mg and lidocaine  1%  Time out completed: Yes  The site of injection was cleaned with alcohol and ethyl chloride.  The injection was given without any complications appropriate precautions were given.  After injection of the right knee this was repeated on the left knee

## 2024-07-14 ENCOUNTER — Ambulatory Visit (INDEPENDENT_AMBULATORY_CARE_PROVIDER_SITE_OTHER): Admitting: Orthopedic Surgery

## 2024-07-14 DIAGNOSIS — G8929 Other chronic pain: Secondary | ICD-10-CM | POA: Diagnosis not present

## 2024-07-14 DIAGNOSIS — M25511 Pain in right shoulder: Secondary | ICD-10-CM | POA: Diagnosis not present

## 2024-07-14 DIAGNOSIS — M25512 Pain in left shoulder: Secondary | ICD-10-CM | POA: Diagnosis not present

## 2024-07-14 MED ORDER — METHYLPREDNISOLONE ACETATE 40 MG/ML IJ SUSP
40.0000 mg | Freq: Once | INTRAMUSCULAR | Status: AC
  Administered 2024-07-14: 40 mg via INTRA_ARTICULAR

## 2024-07-14 NOTE — Progress Notes (Signed)
 Chief Complaint  Patient presents with   Injections    Both shoulders     BMI 58  Procedure subacromial injection    Verbal consent was obtained to inject the  bilateral  Shoulder  Timeout was completed to confirm the injection site as  subacromial space of the  right  shoulder   Medication used   Depo-Medrol  40 mg and lidocaine  1% 3 cc  Anesthesia was provided by ethyl chloride  The injection was performed in the subacromial space   Technique  After cleaning the area with alcohol and spraying with ethyl chloride the medication was injected into the subacromial space of the   right  This was tolerated well without complication  Sterile Band-Aid was applied   The procedure was repeated in the opposite shoulder Yes

## 2024-07-28 ENCOUNTER — Ambulatory Visit: Admitting: Orthopedic Surgery

## 2024-07-29 ENCOUNTER — Ambulatory Visit (INDEPENDENT_AMBULATORY_CARE_PROVIDER_SITE_OTHER): Admitting: Orthopedic Surgery

## 2024-07-29 DIAGNOSIS — M1712 Unilateral primary osteoarthritis, left knee: Secondary | ICD-10-CM

## 2024-07-29 DIAGNOSIS — M170 Bilateral primary osteoarthritis of knee: Secondary | ICD-10-CM

## 2024-07-29 DIAGNOSIS — M1711 Unilateral primary osteoarthritis, right knee: Secondary | ICD-10-CM

## 2024-07-29 MED ORDER — METHYLPREDNISOLONE ACETATE 40 MG/ML IJ SUSP
40.0000 mg | Freq: Once | INTRAMUSCULAR | Status: AC
  Administered 2024-07-29: 40 mg via INTRA_ARTICULAR

## 2024-07-29 NOTE — Progress Notes (Signed)
 Chief Complaint  Patient presents with   Injections    Both knees/work in today for injections     Gwendolyn Gutierrez comes in today for bilateral knee injections  She gave consent for knee injection left and right knee Timeout confirm injection sites  Medication Depo-Medrol  40 mg and 3 cc 1% lidocaine   The skin was cleaned with alcohol sprayed with ethyl chloride and then left knee was injected through a lateral approach  This was repeated on the right knee   The patient was observed and there were no complications

## 2024-08-12 ENCOUNTER — Ambulatory Visit: Admitting: Orthopedic Surgery

## 2024-09-05 ENCOUNTER — Encounter: Payer: Self-pay | Admitting: Orthopedic Surgery

## 2024-09-05 ENCOUNTER — Ambulatory Visit: Admitting: Orthopedic Surgery

## 2024-09-05 DIAGNOSIS — M1712 Unilateral primary osteoarthritis, left knee: Secondary | ICD-10-CM

## 2024-09-05 DIAGNOSIS — M170 Bilateral primary osteoarthritis of knee: Secondary | ICD-10-CM

## 2024-09-05 DIAGNOSIS — M1711 Unilateral primary osteoarthritis, right knee: Secondary | ICD-10-CM

## 2024-09-05 MED ORDER — METHYLPREDNISOLONE ACETATE 40 MG/ML IJ SUSP
40.0000 mg | Freq: Once | INTRAMUSCULAR | Status: AC
  Administered 2024-09-05: 40 mg via INTRA_ARTICULAR

## 2024-09-05 NOTE — Progress Notes (Signed)
 Chief Complaint  Patient presents with   Injections    Both knees      Procedure note for injection   Chief Complaint  Patient presents with   Injections    Both knees      Encounter Diagnoses  Name Primary?   Primary osteoarthritis of right knee Yes   Primary osteoarthritis of left knee         The patient has consented for injection of the RIGHT AND LEFT Joint: knee  Medication: Depo-Medrol  40 mg and lidocaine  1%  Time out completed: Yes  The site of injection was cleaned with alcohol and ethyl chloride.  The injection was given without any complications appropriate precautions were given.

## 2024-09-19 ENCOUNTER — Ambulatory Visit (INDEPENDENT_AMBULATORY_CARE_PROVIDER_SITE_OTHER): Admitting: Orthopedic Surgery

## 2024-09-19 DIAGNOSIS — M25512 Pain in left shoulder: Secondary | ICD-10-CM

## 2024-09-19 DIAGNOSIS — M25511 Pain in right shoulder: Secondary | ICD-10-CM | POA: Diagnosis not present

## 2024-09-19 DIAGNOSIS — G8929 Other chronic pain: Secondary | ICD-10-CM | POA: Diagnosis not present

## 2024-09-19 MED ORDER — METHYLPREDNISOLONE ACETATE 40 MG/ML IJ SUSP
40.0000 mg | Freq: Once | INTRAMUSCULAR | Status: AC
  Administered 2024-09-19: 40 mg via INTRA_ARTICULAR

## 2024-09-19 NOTE — Progress Notes (Signed)
" ° °  Procedure note for injection   Chief Complaint  Patient presents with   Injections    Shoulders      Encounter Diagnosis  Name Primary?   Chronic pain of both shoulders Yes        The patient has consented for injection of the bilateral  Joint: shoulder  Medication: Depo-Medrol  40 mg and lidocaine  1%  Time out completed: Yes  RT SHOULDER: The site of injection was cleaned with alcohol and ethyl chloride.  The injection was given without any complications appropriate precautions were given.  REPEATED PROCEDURE LEFT SHOULDER   "

## 2024-09-29 ENCOUNTER — Encounter: Payer: Self-pay | Admitting: Orthopedic Surgery

## 2024-09-29 ENCOUNTER — Ambulatory Visit: Admitting: Orthopedic Surgery

## 2024-09-29 DIAGNOSIS — M25562 Pain in left knee: Secondary | ICD-10-CM | POA: Diagnosis not present

## 2024-09-29 DIAGNOSIS — G8929 Other chronic pain: Secondary | ICD-10-CM

## 2024-09-29 DIAGNOSIS — M1712 Unilateral primary osteoarthritis, left knee: Secondary | ICD-10-CM

## 2024-09-29 DIAGNOSIS — M25561 Pain in right knee: Secondary | ICD-10-CM | POA: Diagnosis not present

## 2024-09-29 DIAGNOSIS — M1711 Unilateral primary osteoarthritis, right knee: Secondary | ICD-10-CM

## 2024-09-29 DIAGNOSIS — M170 Bilateral primary osteoarthritis of knee: Secondary | ICD-10-CM

## 2024-09-29 MED ORDER — METHYLPREDNISOLONE ACETATE 40 MG/ML IJ SUSP
40.0000 mg | Freq: Once | INTRAMUSCULAR | Status: AC
  Administered 2024-09-29: 40 mg via INTRA_ARTICULAR

## 2024-09-29 MED ORDER — INDOMETHACIN 25 MG PO CAPS: 25.0000 mg | ORAL_CAPSULE | Freq: Two times a day (BID) | ORAL | 0 refills | Status: AC

## 2024-09-29 NOTE — Patient Instructions (Signed)
 Joint Steroid Injection A joint steroid injection is a procedure to relieve swelling and pain in a joint. Steroids are medicines that reduce inflammation. In this procedure, your health care provider uses a syringe and a needle to inject a steroid medicine into a painful and inflamed joint. A pain-relieving medicine (anesthetic) may be injected along with the steroid. In some cases, your health care provider may use an imaging technique such as ultrasound or fluoroscopy to guide the injection. Joints that are often treated with steroid injections include the knee, shoulder, hip, and spine. These injections may also be used in the elbow, ankle, and joints of the hands or feet. You may have joint steroid injections as part of your treatment for inflammation caused by: Gout. Rheumatoid arthritis. Advanced wear-and-tear arthritis (osteoarthritis). Tendinitis. Bursitis. Joint steroid injections may be repeated, but having them too often can damage a joint or the skin over the joint. You should not have joint steroid injections less than 6 weeks apart or more than four times a year. Tell a health care provider about: Any allergies you have. All medicines you are taking, including vitamins, herbs, eye drops, creams, and over-the-counter medicines. Any problems you or family members have had with anesthetic medicines. Any blood disorders you have. Any surgeries you have had. Any medical conditions you have. Whether you are pregnant or may be pregnant. What are the risks? Generally, this is a safe treatment. However, problems may occur, including: Infection. Bleeding. Allergic reactions to medicines. Damage to the joint or tissues around the joint. Thinning of skin or loss of skin color over the joint. Temporary flushing of the face or chest. Temporary increase in pain. Temporary increase in blood sugar. Failure to relieve inflammation or pain. What happens before the treatment? Medicines Ask  your health care provider about: Changing or stopping your regular medicines. This is especially important if you are taking diabetes medicines or blood thinners. Taking medicines such as aspirin and ibuprofen. These medicines can thin your blood. Do not take these medicines unless your health care provider tells you to take them. Taking over-the-counter medicines, vitamins, herbs, and supplements. General instructions You may have imaging tests of your joint. Ask your health care provider if you can drive yourself home after the procedure. What happens during the treatment?  Your health care provider will position you for the injection and locate the injection site over your joint. The skin over the joint will be cleaned with a germ-killing soap. Your health care provider may: Spray a numbing solution (topical anesthetic) over the injection site. Inject a local anesthetic under the skin above your joint. The needle will be placed through your skin into your joint. Your health care provider may use imaging to guide the needle to the right spot for the injection. If imaging is used, a special contrast dye may be injected to confirm that the needle is in the correct location. The steroid medicine will be injected into your joint. Anesthetic may be injected along with the steroid. This may be a medicine that relieves pain for a short time (short-acting anesthetic) or for a longer time (long-acting anesthetic). The needle will be removed, and an adhesive bandage (dressing) will be placed over the injection site. The procedure may vary among health care providers and hospitals. What can I expect after the treatment? You will be able to go home after the treatment. It is normal to feel slight flushing for a few days after the injection. After the treatment, it is  common to have an increase in joint pain after the anesthetic has worn off. This may happen about an hour after a short-acting anesthetic  or about 8 hours after a longer-acting anesthetic. You should begin to feel relief from joint pain and swelling after 24 to 48 hours. Contact your health care provider if you do not begin to feel relief after 2 days. Follow these instructions at home: Injection site care Leave the adhesive dressing over your injection site in place until your health care provider says you can remove it. Check your injection site every day for signs of infection. Check for: More redness, swelling, or pain. Fluid or blood. Warmth. Pus or a bad smell. Activity Return to your normal activities as told by your health care provider. Ask your health care provider what activities are safe for you. You may be asked to limit activities that put stress on the joint for a few days. Do joint exercises as told by your health care provider. Do not take baths, swim, or use a hot tub until your health care provider approves. Ask your health care provider if you may take showers. You may only be allowed to take sponge baths. Managing pain, stiffness, and swelling  If directed, put ice on the joint. To do this: Put ice in a plastic bag. Place a towel between your skin and the bag. Leave the ice on for 20 minutes, 2-3 times a day. Remove the ice if your skin turns bright red. This is very important. If you cannot feel pain, heat, or cold, you have a greater risk of damage to the area. Raise (elevate) your joint above the level of your heart when you are sitting or lying down. General instructions Take over-the-counter and prescription medicines only as told by your health care provider. Do not use any products that contain nicotine or tobacco, such as cigarettes, e-cigarettes, and chewing tobacco. These can delay joint healing. If you need help quitting, ask your health care provider. If you have diabetes, be aware that your blood sugar may be slightly elevated for several days after the injection. Keep all follow-up visits.  This is important. Contact a health care provider if you have: Chills or a fever. Any signs of infection at your injection site. Increased pain or swelling or no relief after 2 days. Summary A joint steroid injection is a treatment to relieve pain and swelling in a joint. Steroids are medicines that reduce inflammation. Your health care provider may add an anesthetic along with the steroid. You may have joint steroid injections as part of your arthritis treatment. Joint steroid injections may be repeated, but having them too often can damage a joint or the skin over the joint. Contact your health care provider if you have a fever, chills, or signs of infection, or if you get no relief from joint pain or swelling. This information is not intended to replace advice given to you by your health care provider. Make sure you discuss any questions you have with your health care provider. Document Revised: 02/03/2020 Document Reviewed: 02/03/2020 Elsevier Patient Education  2024 [employer].

## 2024-09-29 NOTE — Progress Notes (Signed)
 Chief Complaint  Patient presents with   Injections    Both knees      Procedure note for injection   Chief Complaint  Patient presents with   Injections    Both knees      Encounter Diagnoses  Name Primary?   Bilateral chronic knee pain Yes   Primary osteoarthritis of left knee    Primary osteoarthritis of right knee         The patient has consented for injection of the bilateral  Joint: knees  Medication: Depo-Medrol  40 mg and lidocaine  1%  Time out completed: Yes  The site of injection left knee then right kneewas cleaned with alcohol and ethyl chloride.  The injection was given without any complications appropriate precautions were given.  Meds ordered this encounter  Medications   methylPREDNISolone  acetate (DEPO-MEDROL ) injection 40 mg   methylPREDNISolone  acetate (DEPO-MEDROL ) injection 40 mg   indomethacin  (INDOCIN ) 25 MG capsule    Sig: Take 1 capsule (25 mg total) by mouth 2 (two) times daily with a meal.    Dispense:  90 capsule    Refill:  0    ORDER NEEDED FOR A WALKER:  ROLLING

## 2024-10-03 ENCOUNTER — Ambulatory Visit: Admitting: Orthopedic Surgery

## 2024-10-04 ENCOUNTER — Telehealth: Payer: Self-pay | Admitting: Orthopedic Surgery

## 2024-10-04 NOTE — Telephone Encounter (Signed)
 Dr. Areatha pt - pt lvm stating she needs a chart # and a prescription # for a walker faxed to [facility] in [place] [phone].

## 2024-10-05 NOTE — Telephone Encounter (Signed)
 Faxed  office note demographics and order

## 2024-10-05 NOTE — Telephone Encounter (Signed)
" °  To:               Recipient at [phone] Subject:          orders chart notes Result:           The transmission was successful. Explanation:      All Pages Ok Pages Sent:       14 "

## 2024-10-10 ENCOUNTER — Telehealth: Payer: Self-pay | Admitting: Orthopedic Surgery

## 2024-10-10 NOTE — Telephone Encounter (Signed)
 Dr. Areatha pt - transcript from a call:  My name is Gwendolyn Gutierrez and I am a care navigator at [facility], a part of [facility].  And I'm helping a member and a patient of yours , Miss Gwendolyn Gutierrez, date of birth, 1966/08/01.  Miss Gwendolyn Gutierrez is calling in in regards to getting help with a Rollator, and she's working with Camelia and I was given the fax number for [facility] in [place].  That fax number is [phone] is in regards to Miss Gwendolyn Gutierrez asking for a rollator, and [facility] would need that prescription and supporting documents on why a Rollator would be necessary.  Again, this is for Gwendolyn Gutierrez, and she can be reached at [phone].  Thank you.

## 2024-10-11 NOTE — Telephone Encounter (Signed)
 Faxed asked them to call her

## 2024-10-11 NOTE — Telephone Encounter (Signed)
 I called patient to advise they are out of network

## 2024-10-11 NOTE — Telephone Encounter (Signed)
" °                  -------  Fax Transmission Report-------  To:               Recipient at [phone] Subject:          call patient Result:           The transmission was successful. Explanation:      All Pages Ok Pages Sent:       14 Connect Time:     5 minutes, 29 seconds Transmit Time:    10/11/2024 10:14 Transfer Rate:    [id number] Status Code:      0000 Retry Count:      0 Job Id:           [id number] Unique Id:        [id number] Fax Line:         29 Fax Server:       [id number] "

## 2024-10-13 ENCOUNTER — Ambulatory Visit: Admitting: Physician Assistant

## 2024-10-17 ENCOUNTER — Ambulatory Visit: Admitting: Orthopedic Surgery
